# Patient Record
Sex: Female | Born: 1970 | Race: White | Hispanic: No | Marital: Married | Smoking: Former smoker
Health system: Southern US, Community
[De-identification: ages and names within clinical notes are randomized; demographics above are authoritative.]

## PROBLEM LIST (undated history)

## (undated) DIAGNOSIS — F32A Depression, unspecified: Secondary | ICD-10-CM

## (undated) DIAGNOSIS — N951 Menopausal and female climacteric states: Secondary | ICD-10-CM

## (undated) DIAGNOSIS — I2510 Atherosclerotic heart disease of native coronary artery without angina pectoris: Secondary | ICD-10-CM

## (undated) DIAGNOSIS — M797 Fibromyalgia: Secondary | ICD-10-CM

## (undated) DIAGNOSIS — IMO0002 Reserved for concepts with insufficient information to code with codable children: Secondary | ICD-10-CM

## (undated) DIAGNOSIS — M549 Dorsalgia, unspecified: Secondary | ICD-10-CM

## (undated) DIAGNOSIS — G2581 Restless legs syndrome: Secondary | ICD-10-CM

## (undated) DIAGNOSIS — G43909 Migraine, unspecified, not intractable, without status migrainosus: Secondary | ICD-10-CM

## (undated) DIAGNOSIS — G8929 Other chronic pain: Secondary | ICD-10-CM

## (undated) DIAGNOSIS — K219 Gastro-esophageal reflux disease without esophagitis: Secondary | ICD-10-CM

## (undated) DIAGNOSIS — E785 Hyperlipidemia, unspecified: Secondary | ICD-10-CM

## (undated) DIAGNOSIS — F329 Major depressive disorder, single episode, unspecified: Secondary | ICD-10-CM

## (undated) DIAGNOSIS — I10 Essential (primary) hypertension: Secondary | ICD-10-CM

## (undated) DIAGNOSIS — F319 Bipolar disorder, unspecified: Secondary | ICD-10-CM

## (undated) HISTORY — DX: Dorsalgia, unspecified: M54.9

## (undated) HISTORY — DX: Major depressive disorder, single episode, unspecified: F32.9

## (undated) HISTORY — DX: Fibromyalgia: M79.7

## (undated) HISTORY — DX: Bipolar disorder, unspecified: F31.9

## (undated) HISTORY — DX: Gastro-esophageal reflux disease without esophagitis: K21.9

## (undated) HISTORY — DX: Essential (primary) hypertension: I10

## (undated) HISTORY — DX: Menopausal and female climacteric states: N95.1

## (undated) HISTORY — DX: Hyperlipidemia, unspecified: E78.5

## (undated) HISTORY — DX: Depression, unspecified: F32.A

## (undated) HISTORY — DX: Other chronic pain: G89.29

## (undated) HISTORY — PX: CHOLECYSTECTOMY: SHX55

## (undated) HISTORY — DX: Atherosclerotic heart disease of native coronary artery without angina pectoris: I25.10

## (undated) HISTORY — DX: Restless legs syndrome: G25.81

## (undated) HISTORY — PX: ABDOMINAL HYSTERECTOMY: SHX81

## (undated) HISTORY — DX: Reserved for concepts with insufficient information to code with codable children: IMO0002

---

## 2003-04-02 HISTORY — PX: CARDIAC CATHETERIZATION: SHX172

## 2003-04-02 HISTORY — PX: DOPPLER ECHOCARDIOGRAPHY: SHX263

## 2003-04-02 HISTORY — PX: OTHER SURGICAL HISTORY: SHX169

## 2003-12-13 ENCOUNTER — Inpatient Hospital Stay (HOSPITAL_COMMUNITY): Admission: EM | Admit: 2003-12-13 | Discharge: 2003-12-16 | Payer: Self-pay | Admitting: Emergency Medicine

## 2003-12-13 ENCOUNTER — Encounter (INDEPENDENT_AMBULATORY_CARE_PROVIDER_SITE_OTHER): Payer: Self-pay | Admitting: *Deleted

## 2003-12-15 HISTORY — PX: OTHER SURGICAL HISTORY: SHX169

## 2004-01-11 ENCOUNTER — Ambulatory Visit (HOSPITAL_COMMUNITY): Admission: RE | Admit: 2004-01-11 | Discharge: 2004-01-11 | Payer: Self-pay | Admitting: Interventional Cardiology

## 2004-01-12 ENCOUNTER — Ambulatory Visit (HOSPITAL_COMMUNITY): Admission: RE | Admit: 2004-01-12 | Discharge: 2004-01-12 | Payer: Self-pay | Admitting: Cardiology

## 2004-01-21 ENCOUNTER — Emergency Department (HOSPITAL_COMMUNITY): Admission: EM | Admit: 2004-01-21 | Discharge: 2004-01-22 | Payer: Self-pay | Admitting: Emergency Medicine

## 2005-01-24 ENCOUNTER — Emergency Department (HOSPITAL_COMMUNITY): Admission: EM | Admit: 2005-01-24 | Discharge: 2005-01-24 | Payer: Self-pay | Admitting: Emergency Medicine

## 2005-01-25 ENCOUNTER — Emergency Department (HOSPITAL_COMMUNITY): Admission: EM | Admit: 2005-01-25 | Discharge: 2005-01-25 | Payer: Self-pay | Admitting: Emergency Medicine

## 2005-03-27 ENCOUNTER — Observation Stay (HOSPITAL_COMMUNITY): Admission: EM | Admit: 2005-03-27 | Discharge: 2005-03-29 | Payer: Self-pay | Admitting: Emergency Medicine

## 2006-04-30 ENCOUNTER — Ambulatory Visit (HOSPITAL_COMMUNITY): Payer: Self-pay | Admitting: Psychiatry

## 2006-05-14 ENCOUNTER — Ambulatory Visit (HOSPITAL_COMMUNITY): Payer: Self-pay | Admitting: Psychiatry

## 2006-05-21 ENCOUNTER — Ambulatory Visit (HOSPITAL_COMMUNITY): Payer: Self-pay | Admitting: Psychiatry

## 2006-06-11 ENCOUNTER — Ambulatory Visit (HOSPITAL_COMMUNITY): Payer: Self-pay | Admitting: Psychiatry

## 2006-07-09 ENCOUNTER — Ambulatory Visit (HOSPITAL_COMMUNITY): Payer: Self-pay | Admitting: Psychiatry

## 2007-03-19 ENCOUNTER — Ambulatory Visit: Payer: Self-pay | Admitting: Family Medicine

## 2007-03-19 ENCOUNTER — Telehealth: Payer: Self-pay | Admitting: Family Medicine

## 2007-03-19 DIAGNOSIS — G8190 Hemiplegia, unspecified affecting unspecified side: Secondary | ICD-10-CM | POA: Insufficient documentation

## 2007-03-19 DIAGNOSIS — E785 Hyperlipidemia, unspecified: Secondary | ICD-10-CM | POA: Insufficient documentation

## 2007-03-19 DIAGNOSIS — F319 Bipolar disorder, unspecified: Secondary | ICD-10-CM | POA: Insufficient documentation

## 2007-03-20 ENCOUNTER — Encounter: Payer: Self-pay | Admitting: Family Medicine

## 2007-03-20 LAB — CONVERTED CEMR LAB
ALT: 12 units/L (ref 0–35)
AST: 15 units/L (ref 0–37)
Albumin: 4.7 g/dL (ref 3.5–5.2)
Alkaline Phosphatase: 59 units/L (ref 39–117)
BUN: 8 mg/dL (ref 6–23)
Basophils Absolute: 0 10*3/uL (ref 0.0–0.1)
Basophils Relative: 0 % (ref 0–1)
CO2: 27 meq/L (ref 19–32)
Calcium: 10 mg/dL (ref 8.4–10.5)
Chloride: 101 meq/L (ref 96–112)
Creatinine, Ser: 0.64 mg/dL (ref 0.40–1.20)
Direct LDL: 141 mg/dL — ABNORMAL HIGH
Eosinophils Absolute: 0.1 10*3/uL — ABNORMAL LOW (ref 0.2–0.7)
Eosinophils Relative: 1 % (ref 0–5)
Glucose, Bld: 87 mg/dL (ref 70–99)
HCT: 44.1 % (ref 36.0–46.0)
Hemoglobin: 14.5 g/dL (ref 12.0–15.0)
Lymphocytes Relative: 37 % (ref 12–46)
Lymphs Abs: 3.2 10*3/uL (ref 0.7–4.0)
MCHC: 32.9 g/dL (ref 30.0–36.0)
MCV: 100 fL (ref 78.0–100.0)
Monocytes Absolute: 0.8 10*3/uL (ref 0.1–1.0)
Monocytes Relative: 9 % (ref 3–12)
Neutro Abs: 4.5 10*3/uL (ref 1.7–7.7)
Neutrophils Relative %: 52 % (ref 43–77)
Platelets: 282 10*3/uL (ref 150–400)
Potassium: 3.9 meq/L (ref 3.5–5.3)
RBC: 4.41 M/uL (ref 3.87–5.11)
RDW: 13.2 % (ref 11.5–15.5)
Sodium: 142 meq/L (ref 135–145)
Total Bilirubin: 0.5 mg/dL (ref 0.3–1.2)
Total Protein: 8 g/dL (ref 6.0–8.3)
WBC: 8.6 10*3/uL (ref 4.0–10.5)

## 2007-03-23 ENCOUNTER — Telehealth (INDEPENDENT_AMBULATORY_CARE_PROVIDER_SITE_OTHER): Payer: Self-pay | Admitting: *Deleted

## 2007-03-24 ENCOUNTER — Ambulatory Visit: Payer: Self-pay | Admitting: Family Medicine

## 2007-03-24 DIAGNOSIS — M545 Low back pain, unspecified: Secondary | ICD-10-CM | POA: Insufficient documentation

## 2007-04-06 ENCOUNTER — Telehealth: Payer: Self-pay | Admitting: Family Medicine

## 2007-04-10 ENCOUNTER — Encounter: Admission: RE | Admit: 2007-04-10 | Discharge: 2007-04-10 | Payer: Self-pay | Admitting: Orthopedic Surgery

## 2007-04-10 ENCOUNTER — Encounter: Payer: Self-pay | Admitting: Family Medicine

## 2007-04-17 ENCOUNTER — Encounter: Admission: RE | Admit: 2007-04-17 | Discharge: 2007-04-17 | Payer: Self-pay | Admitting: Orthopedic Surgery

## 2007-05-05 ENCOUNTER — Encounter: Payer: Self-pay | Admitting: Family Medicine

## 2007-05-28 ENCOUNTER — Ambulatory Visit: Payer: Self-pay | Admitting: Family Medicine

## 2007-05-28 DIAGNOSIS — F410 Panic disorder [episodic paroxysmal anxiety] without agoraphobia: Secondary | ICD-10-CM | POA: Insufficient documentation

## 2007-07-16 ENCOUNTER — Encounter: Payer: Self-pay | Admitting: Family Medicine

## 2007-11-19 ENCOUNTER — Telehealth: Payer: Self-pay | Admitting: Family Medicine

## 2008-03-30 ENCOUNTER — Ambulatory Visit: Payer: Self-pay | Admitting: Family Medicine

## 2008-03-31 ENCOUNTER — Telehealth: Payer: Self-pay | Admitting: Family Medicine

## 2008-04-04 ENCOUNTER — Telehealth: Payer: Self-pay | Admitting: Family Medicine

## 2008-04-12 ENCOUNTER — Telehealth: Payer: Self-pay | Admitting: Family Medicine

## 2008-04-21 ENCOUNTER — Telehealth (INDEPENDENT_AMBULATORY_CARE_PROVIDER_SITE_OTHER): Payer: Self-pay | Admitting: *Deleted

## 2008-05-03 ENCOUNTER — Encounter: Payer: Self-pay | Admitting: Family Medicine

## 2008-06-01 ENCOUNTER — Ambulatory Visit: Payer: Self-pay | Admitting: Family Medicine

## 2008-06-29 ENCOUNTER — Ambulatory Visit (HOSPITAL_COMMUNITY): Payer: Self-pay | Admitting: Psychiatry

## 2008-07-11 ENCOUNTER — Ambulatory Visit (HOSPITAL_COMMUNITY): Payer: Self-pay | Admitting: Psychiatry

## 2008-07-19 ENCOUNTER — Ambulatory Visit (HOSPITAL_COMMUNITY): Payer: Self-pay | Admitting: Psychiatry

## 2008-07-20 ENCOUNTER — Ambulatory Visit (HOSPITAL_COMMUNITY): Payer: Self-pay | Admitting: Psychiatry

## 2008-09-15 ENCOUNTER — Ambulatory Visit (HOSPITAL_COMMUNITY): Payer: Self-pay | Admitting: Psychiatry

## 2008-10-06 ENCOUNTER — Ambulatory Visit: Payer: Self-pay | Admitting: Family Medicine

## 2008-10-06 DIAGNOSIS — M5137 Other intervertebral disc degeneration, lumbosacral region: Secondary | ICD-10-CM | POA: Insufficient documentation

## 2008-10-06 DIAGNOSIS — N3946 Mixed incontinence: Secondary | ICD-10-CM | POA: Insufficient documentation

## 2008-10-06 LAB — CONVERTED CEMR LAB
Bilirubin Urine: NEGATIVE
Blood in Urine, dipstick: NEGATIVE
Glucose, Urine, Semiquant: NEGATIVE
Ketones, urine, test strip: NEGATIVE
Nitrite: NEGATIVE
Protein, U semiquant: NEGATIVE
Specific Gravity, Urine: >=1.03
Urobilinogen, UA: 0.2
WBC Urine, dipstick: NEGATIVE
pH: 6

## 2008-10-08 ENCOUNTER — Encounter: Admission: RE | Admit: 2008-10-08 | Discharge: 2008-10-08 | Payer: Self-pay | Admitting: Family Medicine

## 2008-10-10 ENCOUNTER — Telehealth: Payer: Self-pay | Admitting: Family Medicine

## 2008-10-13 ENCOUNTER — Telehealth: Payer: Self-pay | Admitting: Family Medicine

## 2008-10-13 ENCOUNTER — Observation Stay (HOSPITAL_COMMUNITY): Admission: EM | Admit: 2008-10-13 | Discharge: 2008-10-14 | Payer: Self-pay | Admitting: Emergency Medicine

## 2008-10-13 ENCOUNTER — Ambulatory Visit: Payer: Self-pay | Admitting: Internal Medicine

## 2008-10-18 ENCOUNTER — Ambulatory Visit: Payer: Self-pay | Admitting: Family Medicine

## 2008-10-20 ENCOUNTER — Telehealth: Payer: Self-pay | Admitting: Family Medicine

## 2008-10-24 ENCOUNTER — Telehealth (INDEPENDENT_AMBULATORY_CARE_PROVIDER_SITE_OTHER): Payer: Self-pay | Admitting: *Deleted

## 2008-10-25 ENCOUNTER — Telehealth (INDEPENDENT_AMBULATORY_CARE_PROVIDER_SITE_OTHER): Payer: Self-pay | Admitting: *Deleted

## 2008-11-01 ENCOUNTER — Ambulatory Visit (HOSPITAL_COMMUNITY): Payer: Self-pay | Admitting: Psychiatry

## 2008-11-03 ENCOUNTER — Telehealth (INDEPENDENT_AMBULATORY_CARE_PROVIDER_SITE_OTHER): Payer: Self-pay | Admitting: *Deleted

## 2008-11-04 ENCOUNTER — Telehealth: Payer: Self-pay | Admitting: Family Medicine

## 2008-11-09 ENCOUNTER — Telehealth (INDEPENDENT_AMBULATORY_CARE_PROVIDER_SITE_OTHER): Payer: Self-pay | Admitting: *Deleted

## 2008-11-11 ENCOUNTER — Telehealth: Payer: Self-pay | Admitting: Family Medicine

## 2008-11-14 ENCOUNTER — Ambulatory Visit: Payer: Self-pay | Admitting: Family Medicine

## 2008-11-14 DIAGNOSIS — R32 Unspecified urinary incontinence: Secondary | ICD-10-CM | POA: Insufficient documentation

## 2008-11-14 DIAGNOSIS — R159 Full incontinence of feces: Secondary | ICD-10-CM | POA: Insufficient documentation

## 2008-11-14 LAB — CONVERTED CEMR LAB
Bilirubin Urine: NEGATIVE
Blood in Urine, dipstick: NEGATIVE
Glucose, Urine, Semiquant: NEGATIVE
Ketones, urine, test strip: NEGATIVE
Nitrite: NEGATIVE
Protein, U semiquant: NEGATIVE
Specific Gravity, Urine: 1.005
Urobilinogen, UA: 0.2
WBC Urine, dipstick: NEGATIVE
pH: 5.5

## 2008-11-15 ENCOUNTER — Encounter: Payer: Self-pay | Admitting: Family Medicine

## 2008-11-16 ENCOUNTER — Telehealth (INDEPENDENT_AMBULATORY_CARE_PROVIDER_SITE_OTHER): Payer: Self-pay | Admitting: *Deleted

## 2008-11-22 ENCOUNTER — Telehealth (INDEPENDENT_AMBULATORY_CARE_PROVIDER_SITE_OTHER): Payer: Self-pay | Admitting: *Deleted

## 2008-11-23 ENCOUNTER — Telehealth: Payer: Self-pay | Admitting: Family Medicine

## 2008-11-24 ENCOUNTER — Ambulatory Visit: Payer: Self-pay | Admitting: Family Medicine

## 2008-11-24 ENCOUNTER — Telehealth (INDEPENDENT_AMBULATORY_CARE_PROVIDER_SITE_OTHER): Payer: Self-pay | Admitting: *Deleted

## 2008-11-25 LAB — CONVERTED CEMR LAB
ALT: 13 units/L (ref 0–35)
AST: 15 units/L (ref 0–37)
Albumin: 4.3 g/dL (ref 3.5–5.2)
Alkaline Phosphatase: 91 units/L (ref 39–117)
BUN: 6 mg/dL (ref 6–23)
Basophils Absolute: 0 10*3/uL (ref 0.0–0.1)
Basophils Relative: 0 % (ref 0–1)
CO2: 23 meq/L (ref 19–32)
Calcium: 9.7 mg/dL (ref 8.4–10.5)
Chloride: 102 meq/L (ref 96–112)
Creatinine, Ser: 0.62 mg/dL (ref 0.40–1.20)
Eosinophils Absolute: 0.1 10*3/uL (ref 0.0–0.7)
Eosinophils Relative: 2 % (ref 0–5)
Glucose, Bld: 84 mg/dL (ref 70–99)
HCT: 42.8 % (ref 36.0–46.0)
Hemoglobin: 14 g/dL (ref 12.0–15.0)
Lymphocytes Relative: 28 % (ref 12–46)
Lymphs Abs: 2.3 10*3/uL (ref 0.7–4.0)
MCHC: 32.7 g/dL (ref 30.0–36.0)
MCV: 95.7 fL (ref 78.0–100.0)
Monocytes Absolute: 0.6 10*3/uL (ref 0.1–1.0)
Monocytes Relative: 7 % (ref 3–12)
Neutro Abs: 5.4 10*3/uL (ref 1.7–7.7)
Neutrophils Relative %: 63 % (ref 43–77)
Platelets: 327 10*3/uL (ref 150–400)
Potassium: 4.2 meq/L (ref 3.5–5.3)
RBC: 4.47 M/uL (ref 3.87–5.11)
RDW: 14.1 % (ref 11.5–15.5)
Sodium: 140 meq/L (ref 135–145)
TSH: 0.937 microintl units/mL (ref 0.350–4.500)
Total Bilirubin: 0.2 mg/dL — ABNORMAL LOW (ref 0.3–1.2)
Total Protein: 7.7 g/dL (ref 6.0–8.3)
WBC: 8.4 10*3/uL (ref 4.0–10.5)

## 2008-12-12 ENCOUNTER — Telehealth: Payer: Self-pay | Admitting: Family Medicine

## 2008-12-14 ENCOUNTER — Encounter: Payer: Self-pay | Admitting: Family Medicine

## 2008-12-20 ENCOUNTER — Telehealth: Payer: Self-pay | Admitting: Family Medicine

## 2008-12-30 ENCOUNTER — Ambulatory Visit (HOSPITAL_COMMUNITY): Payer: Self-pay | Admitting: Psychiatry

## 2009-02-27 ENCOUNTER — Telehealth (INDEPENDENT_AMBULATORY_CARE_PROVIDER_SITE_OTHER): Payer: Self-pay | Admitting: *Deleted

## 2009-03-14 ENCOUNTER — Ambulatory Visit (HOSPITAL_COMMUNITY): Payer: Self-pay | Admitting: Psychiatry

## 2009-03-27 ENCOUNTER — Telehealth: Payer: Self-pay | Admitting: Family Medicine

## 2009-04-03 ENCOUNTER — Ambulatory Visit: Payer: Self-pay | Admitting: Family Medicine

## 2009-04-03 DIAGNOSIS — F17200 Nicotine dependence, unspecified, uncomplicated: Secondary | ICD-10-CM | POA: Insufficient documentation

## 2009-04-03 DIAGNOSIS — N318 Other neuromuscular dysfunction of bladder: Secondary | ICD-10-CM | POA: Insufficient documentation

## 2009-04-03 LAB — CONVERTED CEMR LAB
Bilirubin Urine: NEGATIVE
Glucose, Urine, Semiquant: NEGATIVE
Ketones, urine, test strip: NEGATIVE
Nitrite: NEGATIVE
Protein, U semiquant: NEGATIVE
Specific Gravity, Urine: 1.02
Urobilinogen, UA: 0.2
pH: 6.5

## 2009-04-06 ENCOUNTER — Ambulatory Visit (HOSPITAL_COMMUNITY): Payer: Self-pay | Admitting: Psychiatry

## 2009-04-12 ENCOUNTER — Telehealth: Payer: Self-pay | Admitting: Family Medicine

## 2009-04-13 ENCOUNTER — Ambulatory Visit (HOSPITAL_COMMUNITY): Payer: Self-pay | Admitting: Psychiatry

## 2009-04-13 ENCOUNTER — Telehealth: Payer: Self-pay | Admitting: Family Medicine

## 2009-04-20 ENCOUNTER — Ambulatory Visit (HOSPITAL_COMMUNITY): Payer: Self-pay | Admitting: Psychiatry

## 2009-04-24 ENCOUNTER — Ambulatory Visit: Payer: Self-pay | Admitting: Family Medicine

## 2009-04-24 DIAGNOSIS — IMO0001 Reserved for inherently not codable concepts without codable children: Secondary | ICD-10-CM | POA: Insufficient documentation

## 2009-04-25 ENCOUNTER — Encounter (INDEPENDENT_AMBULATORY_CARE_PROVIDER_SITE_OTHER): Payer: Self-pay | Admitting: *Deleted

## 2009-04-28 ENCOUNTER — Ambulatory Visit (HOSPITAL_COMMUNITY): Payer: Self-pay | Admitting: Psychiatry

## 2009-05-02 ENCOUNTER — Encounter: Payer: Self-pay | Admitting: Family Medicine

## 2009-05-03 ENCOUNTER — Ambulatory Visit (HOSPITAL_COMMUNITY): Payer: Self-pay | Admitting: Psychiatry

## 2009-05-04 ENCOUNTER — Encounter: Payer: Self-pay | Admitting: Family Medicine

## 2009-05-22 ENCOUNTER — Encounter: Payer: Self-pay | Admitting: Family Medicine

## 2009-05-24 ENCOUNTER — Encounter: Payer: Self-pay | Admitting: Family Medicine

## 2009-05-26 LAB — CONVERTED CEMR LAB
ALT: 13 units/L (ref 0–35)
AST: 14 units/L (ref 0–37)
Albumin: 4.2 g/dL (ref 3.5–5.2)
Alkaline Phosphatase: 83 units/L (ref 39–117)
BUN: 8 mg/dL (ref 6–23)
CO2: 31 meq/L (ref 19–32)
Calcium: 9.5 mg/dL (ref 8.4–10.5)
Chloride: 105 meq/L (ref 96–112)
Cholesterol: 267 mg/dL — ABNORMAL HIGH (ref 0–200)
Creatinine, Ser: 0.68 mg/dL (ref 0.40–1.20)
Glucose, Bld: 79 mg/dL (ref 70–99)
HDL: 36 mg/dL — ABNORMAL LOW (ref >39)
LDL Cholesterol: 167 mg/dL — ABNORMAL HIGH (ref 0–99)
Potassium: 4.5 meq/L (ref 3.5–5.3)
Sodium: 143 meq/L (ref 135–145)
Total Bilirubin: 0.2 mg/dL — ABNORMAL LOW (ref 0.3–1.2)
Total CHOL/HDL Ratio: 7.4
Total Protein: 6.9 g/dL (ref 6.0–8.3)
Triglycerides: 320 mg/dL — ABNORMAL HIGH (ref <150)
VLDL: 64 mg/dL — ABNORMAL HIGH (ref 0–40)

## 2009-06-01 ENCOUNTER — Encounter: Payer: Self-pay | Admitting: Family Medicine

## 2009-06-09 ENCOUNTER — Ambulatory Visit: Payer: Self-pay | Admitting: Family Medicine

## 2009-06-09 ENCOUNTER — Telehealth: Payer: Self-pay | Admitting: Family Medicine

## 2009-06-09 DIAGNOSIS — G43009 Migraine without aura, not intractable, without status migrainosus: Secondary | ICD-10-CM | POA: Insufficient documentation

## 2009-06-09 DIAGNOSIS — R112 Nausea with vomiting, unspecified: Secondary | ICD-10-CM | POA: Insufficient documentation

## 2009-06-10 ENCOUNTER — Emergency Department (HOSPITAL_BASED_OUTPATIENT_CLINIC_OR_DEPARTMENT_OTHER): Admission: EM | Admit: 2009-06-10 | Discharge: 2009-06-10 | Payer: Self-pay | Admitting: Emergency Medicine

## 2009-06-13 ENCOUNTER — Encounter: Payer: Self-pay | Admitting: Family Medicine

## 2009-06-19 ENCOUNTER — Ambulatory Visit: Payer: Self-pay | Admitting: Family Medicine

## 2009-06-19 DIAGNOSIS — I10 Essential (primary) hypertension: Secondary | ICD-10-CM | POA: Insufficient documentation

## 2009-06-30 ENCOUNTER — Ambulatory Visit (HOSPITAL_COMMUNITY): Payer: Self-pay | Admitting: Psychiatry

## 2009-07-04 ENCOUNTER — Encounter: Payer: Self-pay | Admitting: Family Medicine

## 2009-07-17 ENCOUNTER — Ambulatory Visit (HOSPITAL_COMMUNITY): Payer: Self-pay | Admitting: Licensed Clinical Social Worker

## 2009-07-18 ENCOUNTER — Encounter: Payer: Self-pay | Admitting: Family Medicine

## 2009-07-31 ENCOUNTER — Ambulatory Visit: Payer: Self-pay | Admitting: Family Medicine

## 2009-08-01 ENCOUNTER — Ambulatory Visit (HOSPITAL_COMMUNITY): Payer: Self-pay | Admitting: Licensed Clinical Social Worker

## 2009-08-18 ENCOUNTER — Encounter: Payer: Self-pay | Admitting: Family Medicine

## 2009-08-21 ENCOUNTER — Telehealth: Payer: Self-pay | Admitting: Family Medicine

## 2009-08-24 ENCOUNTER — Telehealth (INDEPENDENT_AMBULATORY_CARE_PROVIDER_SITE_OTHER): Payer: Self-pay | Admitting: *Deleted

## 2009-08-30 ENCOUNTER — Ambulatory Visit (HOSPITAL_COMMUNITY): Payer: Self-pay | Admitting: Psychiatry

## 2009-08-31 ENCOUNTER — Encounter: Payer: Self-pay | Admitting: Family Medicine

## 2009-12-05 ENCOUNTER — Ambulatory Visit (HOSPITAL_COMMUNITY): Payer: Self-pay | Admitting: Psychiatry

## 2009-12-06 ENCOUNTER — Ambulatory Visit: Payer: Self-pay | Admitting: Family Medicine

## 2009-12-06 DIAGNOSIS — R609 Edema, unspecified: Secondary | ICD-10-CM | POA: Insufficient documentation

## 2009-12-06 DIAGNOSIS — R42 Dizziness and giddiness: Secondary | ICD-10-CM | POA: Insufficient documentation

## 2009-12-07 LAB — CONVERTED CEMR LAB
ALT: 12 units/L (ref 0–35)
AST: 15 units/L (ref 0–37)
Albumin: 4 g/dL (ref 3.5–5.2)
Alkaline Phosphatase: 100 units/L (ref 39–117)
BUN: 4 mg/dL — ABNORMAL LOW (ref 6–23)
CO2: 29 meq/L (ref 19–32)
Calcium: 9.8 mg/dL (ref 8.4–10.5)
Chloride: 105 meq/L (ref 96–112)
Creatinine, Ser: 0.62 mg/dL (ref 0.40–1.20)
Direct LDL: 178 mg/dL — ABNORMAL HIGH
Glucose, Bld: 100 mg/dL — ABNORMAL HIGH (ref 70–99)
Potassium: 4.7 meq/L (ref 3.5–5.3)
Sodium: 142 meq/L (ref 135–145)
Total Bilirubin: 0.3 mg/dL (ref 0.3–1.2)
Total Protein: 7.3 g/dL (ref 6.0–8.3)

## 2009-12-08 ENCOUNTER — Encounter: Payer: Self-pay | Admitting: Family Medicine

## 2009-12-15 ENCOUNTER — Ambulatory Visit (HOSPITAL_COMMUNITY): Payer: Self-pay | Admitting: Licensed Clinical Social Worker

## 2009-12-25 ENCOUNTER — Encounter: Payer: Self-pay | Admitting: Family Medicine

## 2009-12-29 ENCOUNTER — Ambulatory Visit (HOSPITAL_COMMUNITY): Payer: Self-pay | Admitting: Licensed Clinical Social Worker

## 2010-01-01 ENCOUNTER — Encounter: Payer: Self-pay | Admitting: Family Medicine

## 2010-01-12 ENCOUNTER — Ambulatory Visit (HOSPITAL_COMMUNITY): Payer: Self-pay | Admitting: Licensed Clinical Social Worker

## 2010-02-05 ENCOUNTER — Ambulatory Visit (HOSPITAL_COMMUNITY): Payer: Self-pay | Admitting: Psychiatry

## 2010-02-06 ENCOUNTER — Encounter: Payer: Self-pay | Admitting: Family Medicine

## 2010-02-14 ENCOUNTER — Encounter: Payer: Self-pay | Admitting: Family Medicine

## 2010-03-01 ENCOUNTER — Ambulatory Visit (HOSPITAL_COMMUNITY): Payer: Self-pay | Admitting: Licensed Clinical Social Worker

## 2010-03-05 ENCOUNTER — Ambulatory Visit: Payer: Self-pay | Admitting: Family Medicine

## 2010-03-05 ENCOUNTER — Encounter: Admission: RE | Admit: 2010-03-05 | Discharge: 2010-03-05 | Payer: Self-pay | Admitting: Family Medicine

## 2010-03-05 DIAGNOSIS — R079 Chest pain, unspecified: Secondary | ICD-10-CM | POA: Insufficient documentation

## 2010-03-06 ENCOUNTER — Encounter: Payer: Self-pay | Admitting: Family Medicine

## 2010-03-06 LAB — CONVERTED CEMR LAB
ALT: 13 units/L (ref 0–35)
AST: 13 units/L (ref 0–37)
Albumin: 4.2 g/dL (ref 3.5–5.2)
Alkaline Phosphatase: 83 units/L (ref 39–117)
BUN: 4 mg/dL — ABNORMAL LOW (ref 6–23)
Basophils Absolute: 0 10*3/uL (ref 0.0–0.1)
Basophils Relative: 0 % (ref 0–1)
CO2: 23 meq/L (ref 19–32)
Calcium: 9.9 mg/dL (ref 8.4–10.5)
Chloride: 109 meq/L (ref 96–112)
Creatinine, Ser: 0.65 mg/dL (ref 0.40–1.20)
Eosinophils Absolute: 0.1 10*3/uL (ref 0.0–0.7)
Eosinophils Relative: 1 % (ref 0–5)
Glucose, Bld: 102 mg/dL — ABNORMAL HIGH (ref 70–99)
HCT: 40 % (ref 36.0–46.0)
Hemoglobin: 13 g/dL (ref 12.0–15.0)
Lipase: 12 units/L (ref 0–75)
Lymphocytes Relative: 38 % (ref 12–46)
Lymphs Abs: 4.2 10*3/uL — ABNORMAL HIGH (ref 0.7–4.0)
MCHC: 32.5 g/dL (ref 30.0–36.0)
MCV: 98.5 fL (ref 78.0–100.0)
Monocytes Absolute: 0.8 10*3/uL (ref 0.1–1.0)
Monocytes Relative: 7 % (ref 3–12)
Neutro Abs: 6 10*3/uL (ref 1.7–7.7)
Neutrophils Relative %: 55 % (ref 43–77)
Platelets: 355 10*3/uL (ref 150–400)
Potassium: 4.3 meq/L (ref 3.5–5.3)
RBC: 4.06 M/uL (ref 3.87–5.11)
RDW: 13.4 % (ref 11.5–15.5)
Sodium: 144 meq/L (ref 135–145)
Total Bilirubin: 0.2 mg/dL — ABNORMAL LOW (ref 0.3–1.2)
Total Protein: 7.3 g/dL (ref 6.0–8.3)
WBC: 11 10*3/uL — ABNORMAL HIGH (ref 4.0–10.5)

## 2010-03-09 ENCOUNTER — Ambulatory Visit (HOSPITAL_COMMUNITY): Payer: Self-pay | Admitting: Licensed Clinical Social Worker

## 2010-03-20 ENCOUNTER — Ambulatory Visit (HOSPITAL_COMMUNITY): Payer: Self-pay | Admitting: Licensed Clinical Social Worker

## 2010-03-29 ENCOUNTER — Ambulatory Visit (HOSPITAL_COMMUNITY): Payer: Self-pay | Admitting: Licensed Clinical Social Worker

## 2010-04-06 ENCOUNTER — Ambulatory Visit (HOSPITAL_COMMUNITY)
Admission: RE | Admit: 2010-04-06 | Discharge: 2010-04-06 | Payer: Self-pay | Source: Home / Self Care | Attending: Psychiatry | Admitting: Psychiatry

## 2010-04-06 ENCOUNTER — Ambulatory Visit
Admission: RE | Admit: 2010-04-06 | Discharge: 2010-04-06 | Payer: Self-pay | Source: Home / Self Care | Attending: Family Medicine | Admitting: Family Medicine

## 2010-04-20 ENCOUNTER — Ambulatory Visit (HOSPITAL_COMMUNITY): Admit: 2010-04-20 | Payer: Self-pay | Admitting: Licensed Clinical Social Worker

## 2010-05-02 NOTE — Letter (Signed)
Summary: [facility]   Imported By: Lanelle Bal 09/11/2009 12:06:28  _____________________________________________________________________  External Attachment:    Type:   Image     Comment:   External Document

## 2010-05-02 NOTE — Progress Notes (Signed)
Summary: Migrane  Phone Note Call from Patient   Caller: Patient Summary of Call: Dr.Melady Chow    Call Back  [phone]   pharmacy [facility] in [place]  patient said she has had a Migrane for 3 days and she want to know what can she do?  Patient is aware that not more appts sre open for today.Marland KitchenMarland KitchenMarland KitchenGive a Call Back Initial call taken by: Vanessa Swaziland,  June 09, 2009 2:11 PM  Follow-up for Phone Call        recommend visit to UC Follow-up by: Seymour Bars DO,  June 09, 2009 3:04 PM     Appended Document: Migrane Pt aware

## 2010-05-02 NOTE — Assessment & Plan Note (Signed)
Summary: f/u migraines/ HTN   Vital Signs:  Patient profile:   40 year old female Height:      67 inches Weight:      183 pounds BMI:     28.77 O2 Sat:      96 % on Room air Temp:     98.4 degrees F oral Pulse rate:   92 / minute BP sitting:   147 / 99  (left arm) Cuff size:   regular  Vitals Entered By: Payton Spark CMA (June 19, 2009 9:57 AM)  O2 Flow:  Room air CC: 3 mo f/u migraines   Primary Care Provider:  Seymour Bars DO  CC:  3 mo f/u migraines.  History of Present Illness: 40 yo WF presents for f/u migraines.  She went to the ED last wk for a severe migraine.  She is under a lot of stress.  her son was in an MVA and broke his neck.  She is seeing Dr Christell Constant and Merlene Morse.  Her BPs and weights have been going up.   She usually gets mild to moderate HAs 1-2 x a wk and a severe migraine about 1 x  every 6 months.  Excedrin migraine usually helps the mild ones.    She is on neurontin for chronic back pain.   She is seeing Dr Jordan Likes for back injections.    She goes to the urology.  Oxybutynin is not helping her OAB.  Started on Simvastin 40 mg the end of Feb.      Current Medications (verified): 1)  Protonix 40 Mg  Tbec (Pantoprazole Sodium) .... Take 1 Tablet By Mouth Once A Day 2)  Alprazolam 0.5 Mg  Tabs (Alprazolam) .Marland Kitchen.. 1 Tab By Mouth Two Times A Day As Needed Anxiety 3)  Zoloft 100 Mg Tabs (Sertraline Hcl) .... 1.5 Tabs By Mouth Daily 4)  Cyclobenzaprine Hcl 10 Mg  Tabs (Cyclobenzaprine Hcl) .... Take 1 Tablet By Mouth Once A Day At Bedtime As Needed 5)  Seroquel 50 Mg Tabs (Quetiapine Fumarate) .Marland Kitchen.. 1 Tab By Mouth Qhs 6)  Gabapentin 600 Mg Tabs (Gabapentin) .Marland Kitchen.. 1 Tab By Mouth Tid 7)  Mobic 15 Mg Tabs (Meloxicam) .Marland Kitchen.. 1 Tab By Mouth Daily W/ Food 8)  Promethazine Hcl 25 Mg Tabs (Promethazine Hcl) .Marland Kitchen.. 1 Tab By Mouth Q 6 Hrs As Needed Nausea 9)  Lamictal 10)  Oxybutynin Chloride 5 Mg Tabs (Oxybutynin Chloride) .Marland Kitchen.. 1 Tab By Mouth Bid 11)  Simvastatin 40 Mg  Tabs (Simvastatin) .Marland Kitchen.. 1 Tab By Mouth Qhs  Allergies (verified): 1)  ! * Lyrica 2)  ! Benadryl  Past History:  Past Medical History: Reviewed history from 11/14/2008 and no changes required. bipolar d/o-- Dr Christell Constant menopausal syndrome fibromyalgia CAD, 50% occlusion on cath 2006 (Dr Garnette Scheuermann) high chol premature menopause on HRT since 2006 G2P2002 chronic back pain GERD RLS L4-5 disc protrusion with disc dessication and narrowing on MRI 2005; repeat 09-2008  Past Surgical History: Reviewed history from 03/19/2007 and no changes required. TAH with oophorectomy for DUB/ovarian cysts  Social History: Reviewed history from 03/19/2007 and no changes required. On disability for depression.  Used to work at [employer]. Finished HS.  Married to [place]. 2 teenage sons. Smokes 1 ppd x 10 yrs. 4 ETOH/ wk. Plays basketball and walks.  Review of Systems      See HPI  Physical Exam  General:  overwt appearing WF in NAD Head:  normocephalic and atraumatic.   Eyes:  pupils  equal, pupils round, and pupils reactive to light.   Ears:  no external deformities.   Nose:  no nasal discharge.   Mouth:  pharynx pink and moist.   Neck:  no masses.   Lungs:  Normal respiratory effort, chest expands symmetrically. Lungs are clear to auscultation, no crackles or wheezes. Heart:  normal rate, regular rhythm, and no murmur.   Pulses:  2+ radial pulses Extremities:  no E/C/C Neurologic:  gait normal.   Skin:  color normal.   Cervical Nodes:  No lymphadenopathy noted Psych:  flat affect.     Impression & Recommendations:  Problem # 1:  COMMON MIGRAINE (ICD-346.10) Required visit to the ED last wk for intractable severe migraine which has resolved.  Having  ~2 mild HAs/ wk and 1 severe migraine q 6 mos.  Not a candidate for triptans with CAD hx.  On chronic NSAIDs and chronic narcotics for chronic back pain. Will add BB daily for both HTN and migraine prevention.  H/O given on migraine  prevention.  She may be a good candidate for botox injections.    The following medications were removed from the medication list:    Vicodin 5-500 Mg Tabs (Hydrocodone-acetaminophen) .Marland Kitchen... 1-2 tabs by mouth three times a day as needed severe pain Her updated medication list for this problem includes:    Mobic 15 Mg Tabs (Meloxicam) .Marland Kitchen... 1 tab by mouth daily w/ food    Percocet 5-325 Mg Tabs (Oxycodone-acetaminophen) .Marland Kitchen... 1 tab by mouth once daily per dr Jordan Likes    Metoprolol Tartrate 50 Mg Tabs (Metoprolol tartrate) .Marland Kitchen... 1 tab by mouth bid  Problem # 2:  ESSENTIAL HYPERTENSION, BENIGN (ICD-401.1)  Several HIGH BP readings.  Start on Metoprolol 50 mg two times a day for HTN, migraine prevention and CAD hx. RTC in 6 wks. Her updated medication list for this problem includes:    Metoprolol Tartrate 50 Mg Tabs (Metoprolol tartrate) .Marland Kitchen... 1 tab by mouth bid  BP today: 147/99 Prior BP: 156/114 (06/09/2009)  Labs Reviewed: K+: 4.5 (05/24/2009) Creat: : 0.68 (05/24/2009)   Chol: 267 (05/24/2009)   HDL: 36 (05/24/2009)   LDL: 167 (05/24/2009)   TG: 320 (05/24/2009)  Orders: Prescription Created Electronically [phone])  Problem # 3:  HYPERLIPIDEMIA (ICD-272.4) On Simvastatin for 1 month now.  Will repeat FLP with LFTs in 6 wks.  Seroquel/ wt gain unfortunately are not helping. Her updated medication list for this problem includes:    Simvastatin 40 Mg Tabs (Simvastatin) .Marland Kitchen... 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 Has OV with Urology in the next couple wks.  Complete Medication List: 1)  Protonix 40 Mg Tbec (Pantoprazole sodium) .... Take 1 tablet by mouth once a day 2)  Alprazolam 0.5 Mg Tabs (Alprazolam) .Marland Kitchen.. 1 tab by mouth  two times a day as needed anxiety 3)  Zoloft 100 Mg Tabs (Sertraline hcl) .... 1.5 tabs by mouth daily 4)  Cyclobenzaprine Hcl 10 Mg Tabs (Cyclobenzaprine hcl) .... Take 1 tablet by mouth once a day at bedtime as needed 5)  Seroquel 50 Mg Tabs (Quetiapine fumarate) .Marland Kitchen.. 1 tab by mouth qhs 6)  Gabapentin 600 Mg Tabs (Gabapentin) .Marland Kitchen.. 1 tab by mouth tid 7)  Mobic 15 Mg Tabs (Meloxicam) .Marland KitchenMarland KitchenMarland Kitchen 1  tab by mouth daily w/ food 8)  Promethazine Hcl 25 Mg Tabs (Promethazine hcl) .Marland Kitchen.. 1 tab by mouth q 6 hrs as needed nausea 9)  Lamictal  10)  Oxybutynin Chloride 5 Mg Tabs (Oxybutynin chloride) .Marland Kitchen.. 1 tab by mouth bid 11)  Simvastatin 40 Mg Tabs (Simvastatin) .Marland Kitchen.. 1 tab by mouth qhs 12)  Ambien 5 Mg Tabs (Zolpidem tartrate) .... 1/2 tab by mouth at bedtime as needed sleep 13)  Percocet 5-325 Mg Tabs (Oxycodone-acetaminophen) .Marland Kitchen.. 1 tab by mouth once daily per dr Jordan Likes 14)  Metoprolol Tartrate 50 Mg Tabs (Metoprolol tartrate) .Marland Kitchen.. 1 tab by mouth bid 15)  Proair Hfa 108 (90 Base) Mcg/act Aers (Albuterol sulfate) .... 2 puffs q 6 hrs prn  Patient Instructions: 1)  Add Metoprolol 50 mg 2 x a day for high BP and migraine prevention. 2)  Return for f/u migraines along with fasting labs in 6 wks. 3)  F/U with Dr Christell Constant and Merlene Morse. Prescriptions: PROAIR HFA 108 (90 BASE) MCG/ACT AERS (ALBUTEROL SULFATE) 2 puffs q 6 hrs prn  #2 x 1   Entered and Authorized by:   Seymour Bars DO   Signed by:   Seymour Bars DO on 06/19/2009   Method used:   Electronically to        [facility]* (retail)       [address] [place], [place]  [address]       Ph: [phone]       Fax: [phone]   RxID:   [id number] METOPROLOL TARTRATE 50 MG TABS (METOPROLOL TARTRATE) 1 tab by mouth bid  #60 x 2   Entered and Authorized by:   Seymour Bars DO   Signed by:   Seymour Bars DO on 06/19/2009   Method used:   Electronically to        [facility]* (retail)       [address] [place], [place]  [address]        Ph: [phone]       Fax: [phone]   RxID:   [id number]

## 2010-05-02 NOTE — Progress Notes (Signed)
Summary: Vomiting  Phone Note Call from Patient   Caller: Patient Summary of Call: Pt states she has been vomiting since Sat (on & off). I advised Pt to stay hydrated and try eating small amounts of bland foods. Pt has promethazine at home and will call back on Weds if no better.  Initial call taken by: Payton Spark CMA,  Aug 21, 2009 4:31 PM

## 2010-05-02 NOTE — Progress Notes (Signed)
Summary: Back and neck pain  Phone Note Call from Patient   Caller: Patient Summary of Call: After starting Lyrica, Pt c/o mid back and neck pain. Pt did not have this w/ neurontin . Please advise.  Initial call taken by: Payton Spark CMA,  April 12, 2009 12:18 PM  Follow-up for Phone Call        this is not a SE from the Lyrica.  I suggest that she stays on it and if continuing to c/o pain, needs to see pain clinic.   Follow-up by: Seymour Bars DO,  April 12, 2009 12:46 PM     Appended Document: Back and neck pain LMOM informing Pt of the above.

## 2010-05-02 NOTE — Letter (Signed)
Summary: [facility]   Imported By: Lanelle Bal 12/19/2009 13:23:30  _____________________________________________________________________  External Attachment:    Type:   Image     Comment:   External Document

## 2010-05-02 NOTE — Letter (Signed)
Summary: [facility]   Imported By: Lanelle Bal 06/20/2009 10:29:30  _____________________________________________________________________  External Attachment:    Type:   Image     Comment:   External Document

## 2010-05-02 NOTE — Consult Note (Signed)
Summary: [facility]   Imported By: Lanelle Bal 01/17/2010 10:14:38  _____________________________________________________________________  External Attachment:    Type:   Image     Comment:   External Document

## 2010-05-02 NOTE — Letter (Signed)
Summary: [facility]   Imported By: Lanelle Bal 06/12/2009 13:11:13  _____________________________________________________________________  External Attachment:    Type:   Image     Comment:   External Document

## 2010-05-02 NOTE — Assessment & Plan Note (Signed)
Summary: dizziness/ cholesterol   Vital Signs:  Patient profile:   40 year old female Height:      67 inches Weight:      172 pounds BMI:     27.04 O2 Sat:      98 % on Room air Temp:     98.3 degrees F Pulse rate:   85 / minute BP sitting:   128 / 87  (left arm) Cuff size:   regular  Vitals Entered By: Payton Spark CMA (December 06, 2009 9:29 AM)  O2 Flow:  Room air CC: F/U HTN. Also c/o dizziness, blurred vision and ankle swelling.   Primary Care Provider:  Seymour Bars DO  CC:  F/U HTN. Also c/o dizziness and blurred vision and ankle swelling.Marland Kitchen  History of Present Illness: 40 yo WF presents for feeling flushed and crampy on and off for a few weeks.  She c/o wt gain (but is down 11 lbs on our scale).  She c/o  swelling in her ankles at the end of the day.  She is on Metoprolol daily for HTN.  Her dizziness is described as rotational.  She has Nausea but no vomitting with these episodes.  It is sporadic.    She has been off her percocet for about 3 mos after chronic use.  She was referred to Dr Jordan Likes for chronic back pain and had a diskogram at her last visit.  She is supposed to f/u with Dr Channing Mutters but actually had seen Dr Venetia Maxon in Sept 2010 and felt to not be a surgical candidate..  She is taking Excedrin back and body.  It helps a little bit.  She is off Percocet because she failed 2 UDS test -- both + for THC.     Current Medications (verified): 1)  Protonix 40 Mg  Tbec (Pantoprazole Sodium) .... Take 1 Tablet By Mouth Once A Day 2)  Alprazolam 0.5 Mg  Tabs (Alprazolam) .Marland Kitchen.. 1 Tab By Mouth Two Times A Day As Needed Anxiety 3)  Zoloft 100 Mg Tabs (Sertraline Hcl) .... 1.5 Tabs By Mouth Daily 4)  Cyclobenzaprine Hcl 10 Mg  Tabs (Cyclobenzaprine Hcl) .... Take 1 Tablet By Mouth Once A Day At Bedtime As Needed 5)  Seroquel 50 Mg Tabs (Quetiapine Fumarate) .Marland Kitchen.. 1 Tab By Mouth Qhs 6)  Gabapentin 600 Mg Tabs (Gabapentin) .Marland Kitchen.. 1 Tab By Mouth Tid 7)  Mobic 15 Mg Tabs (Meloxicam)  .Marland Kitchen.. 1 Tab By Mouth Daily W/ Food 8)  Promethazine Hcl 25 Mg Tabs (Promethazine Hcl) .Marland Kitchen.. 1 Tab By Mouth Q 6 Hrs As Needed Nausea 9)  Lamictal 150 Mg Tabs (Lamotrigine) .... Take 1 Tab By Mouth Once Daily 10)  Oxybutynin Chloride 5 Mg Tabs (Oxybutynin Chloride) .Marland Kitchen.. 1 Tab By Mouth Bid 11)  Simvastatin 40 Mg Tabs (Simvastatin) .Marland Kitchen.. 1 Tab By Mouth Qhs 12)  Ambien 5 Mg Tabs (Zolpidem Tartrate) .... 1/2 Tab By Mouth At Bedtime As Needed Sleep 13)  Metoprolol Tartrate 50 Mg Tabs (Metoprolol Tartrate) .Marland Kitchen.. 1 Tab By Mouth Bid 14)  Proair Hfa 108 (90 Base) Mcg/act Aers (Albuterol Sulfate) .... 2 Puffs Q 6 Hrs Prn  Allergies (verified): 1)  ! * Lyrica 2)  ! Benadryl  Past History:  Past Medical History: Reviewed history from 11/14/2008 and no changes required. bipolar d/o-- Dr Christell Constant menopausal syndrome fibromyalgia CAD, 50% occlusion on cath 2006 (Dr Garnette Scheuermann) high chol premature menopause on HRT since 2006 G2P2002 chronic back pain GERD RLS L4-5 disc protrusion  with disc dessication and narrowing on MRI 2005; repeat 09-2008  Past Surgical History: Reviewed history from 03/19/2007 and no changes required. TAH with oophorectomy for DUB/ovarian cysts  Social History: Reviewed history from 03/19/2007 and no changes required. On disability for depression.  Used to work at [employer]. Finished HS.  Married to [place]. 2 teenage sons. Smokes 1 ppd x 10 yrs. 4 ETOH/ wk. Plays basketball and walks.  Review of Systems      See HPI  Physical Exam  General:  alert, well-developed, well-nourished, and well-hydrated.   Head:  normocephalic and atraumatic.   Eyes:  no nystagmus; PERRLA, wears glasses Ears:  both TMs dull and retracted Nose:  no rhinorrhea Mouth:  pharynx pink and moist.   Neck:  no masses.  full ROM Lungs:  Normal respiratory effort, chest expands symmetrically. Lungs are clear to auscultation, no crackles or wheezes. Heart:  Normal rate and regular rhythm. S1 and S2  normal without gallop, murmur, click, rub or other extra sounds. Abdomen:  soft, non-tender, normal bowel sounds, no distention, no masses, no guarding, no hepatomegaly, and no splenomegaly.   Pulses:  2+ radial pulses Extremities:  no UE or LE edema Neurologic:  neg Dix Hallpike Maneuver Skin:  sun damanged skin umbilical piercing Cervical Nodes:  No lymphadenopathy noted Psych:  good eye contact, not anxious appearing, and not depressed appearing.     Impression & Recommendations:  Problem # 1:  DIZZINESS (ICD-780.4) Questionable if this is eustachian tube dysfunction (likely) vs BPPV. Her Gilberto Better is negative and she has findings c/w eustachian tube dysfunction. Will treat with Lodrane samples in the AM and Dramamine as needed at night.  Call if not improved by Mon. Well hydrated and normotensive on meds. This may be a narcotic w/drawl SE.   Her updated medication list for this problem includes:    Promethazine Hcl 25 Mg Tabs (Promethazine hcl) .Marland Kitchen... 1 tab by mouth q 6 hrs as needed nausea    Lodrane 24 12 Mg Xr24h-cap (Brompheniramine maleate) .Marland Kitchen... 1-2 capsules by mouth once daily for allergies  Problem # 2:  EDEMA (ICD-782.3) None on exam but she c/o dependent edema.  Will update her CMP today to check albumin, renal and hepatic function. She is actually down 11 lbs.   Orders: T-Comprehensive Metabolic Panel ([id number])  Problem # 3:  BACK PAIN, LUMBAR (ICD-724.2) Chronic and off narcotics after failing her last 2 UDS with Dr Jordan Likes.  I will send a note back to Dr Jordan Likes re: her last visit with [facility].   The following medications were removed from the medication list:    Percocet 5-325 Mg Tabs (Oxycodone-acetaminophen) .Marland Kitchen... 1 tab by mouth once daily per dr Jordan Likes Her updated medication list for this problem includes:    Cyclobenzaprine Hcl 10 Mg Tabs (Cyclobenzaprine hcl) .Marland Kitchen... Take 1 tablet by mouth once a day at bedtime as needed    Mobic 15 Mg Tabs (Meloxicam)  .Marland Kitchen... 1 tab by mouth daily w/ food  Problem # 4:  HYPERLIPIDEMIA (ICD-272.4) She is due to recheck her LDL and LFTS for new start statin in Feb. Her updated medication list for this problem includes:    Simvastatin 40 Mg Tabs (Simvastatin) .Marland Kitchen... 1 tab by mouth qhs  Orders: T-LDL Direct ([id number])  Labs Reviewed: SGOT: 14 (05/24/2009)   SGPT: 13 (05/24/2009)   HDL:36 (05/24/2009)  LDL:167 (05/24/2009)  Chol:267 (05/24/2009)  Trig:320 (05/24/2009)  Complete Medication List: 1)  Protonix 40 Mg Tbec (Pantoprazole sodium) .... Take  1 tablet by mouth once a day 2)  Alprazolam 0.5 Mg Tabs (Alprazolam) .Marland Kitchen.. 1 tab by mouth two times a day as needed anxiety 3)  Zoloft 100 Mg Tabs (Sertraline hcl) .... 1.5 tabs by mouth daily 4)  Cyclobenzaprine Hcl 10 Mg Tabs (Cyclobenzaprine hcl) .... Take 1 tablet by mouth once a day at bedtime as needed 5)  Seroquel 50 Mg Tabs (Quetiapine fumarate) .Marland Kitchen.. 1 tab by mouth qhs 6)  Gabapentin 600 Mg Tabs (Gabapentin) .Marland Kitchen.. 1 tab by mouth tid 7)  Mobic 15 Mg Tabs (Meloxicam) .Marland Kitchen.. 1 tab by mouth daily w/ food 8)  Promethazine Hcl 25 Mg Tabs (Promethazine hcl) .Marland Kitchen.. 1 tab by mouth q 6 hrs as needed nausea 9)  Lamictal 150 Mg Tabs (Lamotrigine) .... Take 1 tab by mouth once daily 10)  Oxybutynin Chloride 5 Mg Tabs (Oxybutynin chloride) .Marland Kitchen.. 1 tab by mouth bid 11)  Simvastatin 40 Mg Tabs (Simvastatin) .Marland Kitchen.. 1 tab by mouth qhs 12)  Ambien 5 Mg Tabs (Zolpidem tartrate) .... 1/2 tab by mouth at bedtime as needed sleep 13)  Metoprolol Tartrate 50 Mg Tabs (Metoprolol tartrate) .Marland Kitchen.. 1 tab by mouth bid 14)  Proair Hfa 108 (90 Base) Mcg/act Aers (Albuterol sulfate) .... 2 puffs q 6 hrs prn 15)  Lodrane 24 12 Mg Xr24h-cap (Brompheniramine maleate) .Marland Kitchen.. 1-2 capsules by mouth once daily for allergies  Other Orders: Ophthalmology Referral (Ophthalmology) Admin 1st Vaccine ([id number]) Flu Vaccine 71yrs + [phone])  Patient Instructions: 1)  Update labs downstairs today to recheck  cholesterol and cause for swelling. 2)  Will get you into Dr Clearance Coots for an eye exam. 3)  Take Lodrane 2 capsules by mouth once daily for allergies (8 days given).  This should help w/ your dizziness. 4)  Add OTC Dramamine if needed for nighttime use. 5)  BP looks perfect. 6)  I will f/u with Dr Cyndia Diver office. 7)  REturn for follow up in 4 mos. Flu Vaccine Consent Questions     Do you have a history of severe allergic reactions to this vaccine? no    Any prior history of allergic reactions to egg and/or gelatin? no    Do you have a sensitivity to the preservative Thimersol? no    Do you have a past history of Guillan-Barre Syndrome? no    Do you currently have an acute febrile illness? no    Have you ever had a severe reaction to latex? no    Vaccine information given and explained to patient? yes    Are you currently pregnant? no    Lot Number:[id number]   Exp Date:09/29/2010   Site Given  Left Deltoid IMflu

## 2010-05-02 NOTE — Assessment & Plan Note (Signed)
Summary: [name] and now both side hurt- jr   Vital Signs:  Patient profile:   40 year old female Height:      67 inches Weight:      170 pounds BMI:     26.72 O2 Sat:      98 % on Room air Pulse rate:   83 / minute BP sitting:   140 / 92  (left arm) Cuff size:   regular  Vitals Entered By: Payton Spark CMA (March 05, 2010 11:42 AM)  O2 Flow:  Room air CC: [name] out of truck 2 weeks ago. Has L sided pain, vomiting and painful BM (on and off) since fall.     Primary Care Shoni Quijas:  Seymour Bars DO  CC:  [name] out of truck 2 weeks ago. Has L sided pain and vomiting and painful BM (on and off) since fall.  Marland Kitchen  History of Present Illness: 40 yo WF presents for a fall that occured 2 wks ago.  Her nephew got a truck that was high up and she was trying to climb in but she fell backwards onto the pavement.  Her sister caught her head.  She fell onto her L side.  She did not go to the ED.  She got up and went home.  She layed down and the next morning, she felt sore and had vomitting.  She felt constipated.  She had pain in her LLQ and the ribs on her L lower side.   Has pain with deep breathing.  She denies rectal bleeding or blood in her urine.  Denies problems voiding.  She no longer has N/V but she continues to have constipation and abd fullness.    Allergies: 1)  ! * Lyrica 2)  ! Benadryl  Past History:  Past Medical History: Reviewed history from 11/14/2008 and no changes required. bipolar d/o-- Dr Christell Constant menopausal syndrome fibromyalgia CAD, 50% occlusion on cath 2006 (Dr Garnette Scheuermann) high chol premature menopause on HRT since 2006 G2P2002 chronic back pain GERD RLS L4-5 disc protrusion with disc dessication and narrowing on MRI 2005; repeat 09-2008  Past Surgical History: Reviewed history from 03/19/2007 and no changes required. TAH with oophorectomy for DUB/ovarian cysts  Social History: Reviewed history from 03/19/2007 and no changes required. On disability for  depression.  Used to work at [employer]. Finished HS.  Married to [place]. 2 teenage sons. Smokes 1 ppd x 10 yrs. 4 ETOH/ wk. Plays basketball and walks.  Review of Systems      See HPI  Physical Exam  General:  alert, well-developed, well-nourished, and well-hydrated.   Head:  normocephalic and atraumatic.   Eyes:  sclera non icteric Mouth:  pharynx pink and moist.   Neck:  no masses.   Chest Wall:  L anterior and posterior lower ribs TTP but no bruising. Lungs:  Normal respiratory effort, chest expands symmetrically. Lungs are clear to auscultation, no crackles or wheezes.  splinting with deep inspiration Heart:  Normal rate and regular rhythm. S1 and S2 normal without gallop, murmur, click, rub or other extra sounds. Abdomen:  moderately distended with hypoactive BS and diffusely TTP with vol guarding.  No rigidity Msk:  full hip and L spine ROM.   Pulses:  2+ radial and pedal pulses Extremities:  no LE edema Skin:  no bruising or redness over the L side of the body Psych:  good eye contact, not anxious appearing, and not depressed appearing.     Impression & Recommendations:  Problem # 1:  NAUSEA AND VOMITING (ICD-787.01) I suspect this is related to her constipation given her hx and Phys exam findings.  I will check labs and an AAS on her today.  Likely to be unrelated to her fall.  Since she is not actively having N/V, I did not add any new meds today.  Suggested adding Miralax once daily for constipation and if not improving, will get her back in with her GI doctor. Orders: T-CBC w/Diff ([id number]) T-Comprehensive Metabolic Panel [id number]) T-Lipase ([id number]) T-DG ABD Acute w/Chest (44010)  Problem # 2:  RIB PAIN, LEFT SIDED (ICD-786.50) 2 wks after a fall backwards from a high truck.  She is splinting some and has pain but no bruising.  Suspect rib contusion but will include a CXR with abd films today. Orders: T-DG ABD Acute w/Chest (27253)  Complete Medication  List: 1)  Protonix 40 Mg Tbec (Pantoprazole sodium) .... Take 1 tablet by mouth once a day 2)  Alprazolam 0.5 Mg Tabs (Alprazolam) .Marland Kitchen.. 1 tab by mouth two times a day as needed anxiety 3)  Zoloft 100 Mg Tabs (Sertraline hcl) .... 1.5 tabs by mouth daily 4)  Cyclobenzaprine Hcl 10 Mg Tabs (Cyclobenzaprine hcl) .... Take 1 tablet by mouth once a day at bedtime as needed 5)  Seroquel 50 Mg Tabs (Quetiapine fumarate) .Marland Kitchen.. 1 tab by mouth qhs 6)  Gabapentin 600 Mg Tabs (Gabapentin) .Marland Kitchen.. 1 tab by mouth tid 7)  Mobic 15 Mg Tabs (Meloxicam) .Marland Kitchen.. 1 tab by mouth daily w/ food 8)  Promethazine Hcl 25 Mg Tabs (Promethazine hcl) .Marland Kitchen.. 1 tab by mouth q 6 hrs as needed nausea 9)  Lamictal 150 Mg Tabs (Lamotrigine) .... Take 1 tab by mouth once daily 10)  Oxybutynin Chloride 5 Mg Tabs (Oxybutynin chloride) .Marland Kitchen.. 1 tab by mouth bid 11)  Crestor 20 Mg Tabs (Rosuvastatin calcium) .Marland Kitchen.. 1 tab by mouth qhs 12)  Ambien 5 Mg Tabs (Zolpidem tartrate) .... 1/2 tab by mouth at bedtime as needed sleep 13)  Metoprolol Tartrate 50 Mg Tabs (Metoprolol tartrate) .Marland Kitchen.. 1 tab by mouth bid 14)  Proair Hfa 108 (90 Base) Mcg/act Aers (Albuterol sulfate) .... 2 puffs q 6 hrs prn 15)  Lodrane 24 12 Mg Xr24h-cap (Brompheniramine maleate) .Marland Kitchen.. 1-2 capsules by mouth once daily for allergies  Patient Instructions: 1)  Return downstairs any time after 1 pm today for XRAY and LABS.   2)  Will call you w/ results as soon as I get them. 3)  For constipation:  use Miralax once daily until bowels regulate.   Orders Added: 1)  T-CBC w/Diff [[id number]] 2)  T-Comprehensive Metabolic Panel [[id number]] 3)  T-Lipase [[id number]] 4)  T-DG ABD Acute w/Chest [74022] 5)  Est. Patient Level IV [59563]

## 2010-05-02 NOTE — Miscellaneous (Signed)
  Clinical Lists Changes  Medications: Changed medication from NEURONTIN 300 MG CAPS (GABAPENTIN) 1 capsule by mouth at bedtime x 1 wk then 2 capsules by mouth qhs to GABAPENTIN 600 MG TABS (GABAPENTIN) 1 tab by mouth tid

## 2010-05-02 NOTE — Letter (Signed)
Summary: [facility]  [facility]   Imported By: Lanelle Bal 09/05/2009 11:54:08  _____________________________________________________________________  External Attachment:    Type:   Image     Comment:   External Document

## 2010-05-02 NOTE — Letter (Signed)
Summary: [facility]  [facility]   Imported By: Lanelle Bal 02/26/2010 10:50:04  _____________________________________________________________________  External Attachment:    Type:   Image     Comment:   External Document

## 2010-05-02 NOTE — Assessment & Plan Note (Signed)
Summary: ?UTI/ change to Lyrica   Vital Signs:  Patient profile:   40 year old female Height:      67 inches Weight:      170 pounds BMI:     26.72 O2 Sat:      96 % on Room air Temp:     98.5 degrees F oral Pulse rate:   87 / minute BP sitting:   123 / 82  (left arm) Cuff size:   regular  Vitals Entered By: Payton Spark CMA (April 03, 2009 9:22 AM)  O2 Flow:  Room air CC: Urinary problems.    Primary Care Provider:  Seymour Bars DO  CC:  Urinary problems. .  History of Present Illness: 40 yo WF presents for dysuria x 5 days with urgency and frequency.  She has chronic problems with OAB and incontinence.  She has some urge and stress incontinence.  She has been coughing for the past 2 wks. She is a smoker and has some increased DOE and chest tightness.  No sputum.   She has been sweaty but no fevers or chills.  Has some suprapubic pain.  She has not noticed any vaginal discharge.  She drinks 4 16 oz sodas/ day.    Pap smear last year 2009 at [facility]. MRI L spine earlier in 2010 ruled out cauda equina syndrome.  Has had OAB symptoms for months but was never tried on meds.  She is not seeing pain mgmt for her chronic LBP/ fibromyalgia and refused to f/u with Neurosurgery.  She is on Neurontin but doesn't feel lke it's helping much.  Wants to try Lyrica.          Allergies: 1)  ! * Lyrica  Past History:  Past Medical History: Reviewed history from 11/14/2008 and no changes required. bipolar d/o-- Dr Christell Constant menopausal syndrome fibromyalgia CAD, 50% occlusion on cath 2006 (Dr Garnette Scheuermann) high chol premature menopause on HRT since 2006 G2P2002 chronic back pain GERD RLS L4-5 disc protrusion with disc dessication and narrowing on MRI 2005; repeat 09-2008  Past Surgical History: Reviewed history from 03/19/2007 and no changes required. TAH with oophorectomy for DUB/ovarian cysts  Social History: Reviewed history from 03/19/2007 and no changes required. On  disability for depression.  Used to work at [employer]. Finished HS.  Married to [place]. 2 teenage sons. Smokes 1 ppd x 10 yrs. 4 ETOH/ wk. Plays basketball and walks.  Review of Systems      See HPI  Physical Exam  General:  alert, well-developed, well-nourished, well-hydrated, and overweight-appearing.   Head:  normocephalic and atraumatic.   Eyes:  sclera non icteric wearing glasses Nose:  no nasal discharge.   Mouth:  clear postnasal drip; throat is mildly injected Neck:  no masses.   Lungs:  Normal respiratory effort, chest expands symmetrically. Lungs are clear to auscultation, no crackles or wheezes.  Dry cough.   Heart:  Normal rate and regular rhythm. S1 and S2 normal without gallop, murmur, click, rub or other extra sounds. Abdomen:  suprapubic TTP w/o R/G/R.  L CVAT.  soft.  ND.  NABS. Extremities:  no LE edema Skin:  color normal.   Cervical Nodes:  No lymphadenopathy noted Psych:  flat affect.     Impression & Recommendations:  Problem # 1:  UTI (ICD-599.0) UA is equivocal for infection with trace blood and LE.  Consider IC as dx given her hx. Treat with 5 days of Levaquin which also covers her URI/ atypical  PNA symptoms. Call if dysuria is not improving, o/w repeat UA with cx at 3 wk f/u and if still symptomatic, set up with urology. Her updated medication list for this problem includes:    Levaquin 750 Mg Tabs (Levofloxacin) .Marland Kitchen... 1 tab by mouth daily x 5 days    Detrol La 4 Mg Xr24h-cap (Tolterodine tartrate) .Marland Kitchen... 1 capsule by mouth daily  Problem # 2:  OVERACTIVE BLADDER (ICD-596.51) Start Detrol LA 4 mg daily for OAB symptoms. Avoid caffeine. F/U in 3 wks.  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  F/U in 3 wks. Not on chronic narcotics.  Failed to comply with NS recommendations.  Problem # 4:  CIGARETTE SMOKER (ICD-305.1) Smoker with URI, prolonged cough. Encouraged cessation. Given cough symptoms, ProAir HFA sample  given w/ instructions. Set up for PFTs this year.  Complete Medication List: 1)  Protonix 40 Mg Tbec (Pantoprazole sodium) .... Take 1 tablet by mouth once a day 2)  Alprazolam 0.5 Mg Tabs (Alprazolam) .Marland Kitchen.. 1 tab by mouth two times a day as needed anxiety 3)  Zoloft 100 Mg Tabs (Sertraline hcl) .... 1.5 tabs by mouth daily 4)  Cyclobenzaprine Hcl 10 Mg Tabs (Cyclobenzaprine hcl) .... Take 1 tablet by mouth once a day at bedtime as needed 5)  Seroquel 50 Mg Tabs (Quetiapine fumarate) .Marland Kitchen.. 1 tab by mouth qhs 6)  Lyrica 75 Mg Caps (Pregabalin) .Marland Kitchen.. 1 capsule by mouth at bedtime x 1 wk then 1 capsule by mouth bid 7)  Mobic 15 Mg Tabs (Meloxicam) .Marland Kitchen.. 1 tab by mouth daily w/ food 8)  Tramadol Hcl 50 Mg Tabs (Tramadol hcl) .Marland Kitchen.. 1 tab by mouth two times a day as needed pain 9)  Promethazine Hcl 25 Mg Tabs (Promethazine hcl) .Marland Kitchen.. 1 tab by mouth q 6 hrs as needed nausea 10)  Lamictal  11)  Levaquin 750 Mg Tabs (Levofloxacin) .Marland Kitchen.. 1 tab by mouth daily x 5 days 12)  Detrol La 4 Mg Xr24h-cap (Tolterodine tartrate) .Marland Kitchen.. 1 capsule by mouth daily  Other Orders: UA Dipstick w/o Micro (automated)  (81003)  Patient Instructions: 1)  Take 5 days of Levaquin for both UTI and prolonged cough. 2)  Use the Inhaler 2 puffs 4 x a day for the next wk and avoid smoking. 3)  Avoid caffeine. 4)  Change Neurontin (gabapentin) to Lyrica.  Begin 75 mg of Lyrica tonight.   5)  Start Detrol LA once daily for overactive bladder. 6)  Follow up in 3 wks to see if symptoms are improving. Prescriptions: DETROL LA 4 MG XR24H-CAP (TOLTERODINE TARTRATE) 1 capsule by mouth daily  #30 x 1   Entered and Authorized by:   Seymour Bars DO   Signed by:   Seymour Bars DO on 04/03/2009   Method used:   Electronically to        [facility]* (retail)       [address] [place], [place]  [address]       Ph: [phone]       Fax: [phone]   RxID:   [id number] LEVAQUIN 750 MG TABS (LEVOFLOXACIN) 1 tab by mouth daily  x 5 days  #5 tabs x 0   Entered and Authorized by:   Seymour Bars DO   Signed by:   Seymour Bars DO on 04/03/2009   Method used:   Electronically to        [facility]  [address] (retail)       [address] [place], [place]  [address]       Ph: [phone]       Fax: [phone]   RxID:   [id number] LYRICA 75 MG CAPS (PREGABALIN) 1 capsule by mouth at bedtime x 1 wk then 1 capsule by mouth bid  #60 x 2   Entered and Authorized by:   Seymour Bars DO   Signed by:   Seymour Bars DO on 04/03/2009   Method used:   Printed then faxed to ...       [facility]* (retail)       [address] [place], [place]  [address]       Ph: [phone]       Fax: [phone]   RxID:   [id number]    Orders Added: 1)  UA Dipstick w/o Micro (automated)  [81003] 2)  Est. Patient Level IV [88416]  Laboratory Results   Urine Tests    Routine Urinalysis   Color: yellow Appearance: Clear Glucose: negative   (Normal Range: Negative) Bilirubin: negative   (Normal Range: Negative) Ketone: negative   (Normal Range: Negative) Spec. Gravity: 1.020   (Normal Range: 1.003-1.035) Blood: trace-intact   (Normal Range: Negative) pH: 6.5   (Normal Range: 5.0-8.0) Protein: negative   (Normal Range: Negative) Urobilinogen: 0.2   (Normal Range: 0-1) Nitrite: negative   (Normal Range: Negative) Leukocyte Esterace: trace   (Normal Range: Negative)

## 2010-05-02 NOTE — Progress Notes (Signed)
Summary: C/o pain  Phone Note Call from Patient   Caller: Patient Summary of Call: Pt called again [facility] c/o back pain and stating meds are not working. Is it OK to go ahead and refer to pain clinic? Initial call taken by: Payton Spark CMA,  April 13, 2009 8:45 AM  Follow-up for Phone Call        yes, please do so.  Start with Dr Jordan Likes. Follow-up by: Seymour Bars DO,  April 13, 2009 10:16 AM     Appended Document: C/o pain LMOM informing Pt of the above.

## 2010-05-02 NOTE — Assessment & Plan Note (Signed)
Summary: headache x 3 dys rm 1   Vital Signs:  Patient Profile:   40 Years Old Female CC:      headache x 3 dys  Height:     67 inches Weight:      168 pounds O2 Sat:      97 % O2 treatment:    Room Air Temp:     97.6 degrees F oral Pulse rate:   118 / minute Pulse rhythm:   irregular Resp:     16 per minute BP sitting:   156 / 114  (right arm) Cuff size:   regular  Vitals Entered By: Areta Haber CMA (June 09, 2009 4:20 PM)                  Current Allergies: ! * LYRICA ! BENADRYL    History of Present Illness Chief Complaint: headache x 3 dys  History of Present Illness: Subjective:  Patient complains of developing a mild occipital headache 3 days ago that then progressed into a typical generalized migraine headache.  She states that she has a similar headache once or twice yearly.  She has been having nausea/vomiting.  No fevers, chills, and sweats.  No respiratory, GU, or GYN symptoms.  No localizing neuro symptoms.  Headache did not respond to Excedrin Migraine.  She tried a Phenergan tab this morning but vomited afterwards.  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) PANIC ATTACK (ICD-300.01) BACK PAIN, LUMBAR (ICD-724.2) BIPOLAR DISORDER UNSPECIFIED (ICD-296.80) WEAKNESS, LEFT SIDE OF BODY (ICD-342.90) HYPERLIPIDEMIA (ICD-272.4)   Current Meds PROTONIX 40 MG  TBEC (PANTOPRAZOLE SODIUM) Take 1 tablet by mouth once a day ALPRAZOLAM 0.5 MG  TABS (ALPRAZOLAM) 1 tab by mouth two times a day as needed anxiety ZOLOFT 100 MG TABS (SERTRALINE HCL) 1.5 tabs by mouth daily CYCLOBENZAPRINE HCL 10 MG  TABS (CYCLOBENZAPRINE HCL) Take 1 tablet by mouth once a day at bedtime as  needed SEROQUEL 50 MG TABS (QUETIAPINE FUMARATE) 1 tab by mouth qhs GABAPENTIN 600 MG TABS (GABAPENTIN) 1 tab by mouth tid MOBIC 15 MG TABS (MELOXICAM) 1 tab by mouth daily w/ food PROMETHAZINE HCL 25 MG TABS (PROMETHAZINE HCL) 1 tab by mouth q 6 hrs as needed nausea * LAMICTAL  VICODIN 5-500 MG TABS (HYDROCODONE-ACETAMINOPHEN) 1-2 tabs by mouth three times a day as needed severe pain OXYBUTYNIN CHLORIDE 5 MG TABS (OXYBUTYNIN CHLORIDE) 1 tab by mouth bid SIMVASTATIN 40 MG TABS (SIMVASTATIN) 1 tab by mouth qhs PROMETHAZINE HCL 25 MG SUPP (PROMETHAZINE HCL) One PR q4 to 6hr as needed for nausea/vomiting  REVIEW OF SYSTEMS Constitutional Symptoms      Denies fever, chills, night sweats, weight loss, weight gain, and fatigue.  Eyes       Denies change in vision, eye pain, eye discharge, glasses, contact lenses, and eye surgery. Ear/Nose/Throat/Mouth       Denies hearing loss/aids, change in hearing, ear pain, ear discharge, dizziness, frequent runny nose, frequent nose bleeds, sinus problems, sore throat, hoarseness, and tooth pain or bleeding.  Respiratory       Denies dry cough, productive cough, wheezing, shortness of breath, asthma, bronchitis, and emphysema/COPD.  Cardiovascular       Denies murmurs, chest pain, and tires easily with exhertion.    Gastrointestinal       Complains of nausea/vomiting.  Denies stomach pain, diarrhea, constipation, blood in bowel movements, and indigestion.      Comments: x 3 dys Genitourniary       Denies painful urination, kidney stones, and loss of urinary control. Neurological       Complains of headaches.      Denies paralysis, seizures, and fainting/blackouts. Musculoskeletal       Denies muscle pain, joint pain, joint stiffness, decreased range of motion, redness, swelling, muscle weakness, and gout.  Skin       Denies bruising, unusual mles/lumps or sores, and hair/skin or nail changes.  Psych       Denies mood changes, temper/anger issues,  anxiety/stress, speech problems, depression, and sleep problems. Other Comments: Pt has not seen PCP for this.   Past History:  Past Medical History: Last updated: 11/14/2008 bipolar d/o-- Dr Christell Constant menopausal syndrome fibromyalgia CAD, 50% occlusion on cath 2006 (Dr Garnette Scheuermann) high chol premature menopause on HRT since 2006 G2P2002 chronic back pain GERD RLS L4-5 disc protrusion with disc dessication and narrowing on MRI 2005; repeat 09-2008  Past Surgical History: Last updated: 03/19/2007 TAH with oophorectomy for DUB/ovarian cysts  Family History: Last updated: 03/20/2007 father AMI in 24's, ETOH mother stroke at 78, DVT, high chol sister healthy MOM-DVT,PAD GRANDMA AND COUSIN-LUPUS  Social History: Last updated: 03/19/2007 On disability for depression.  Used to work at [employer]. Finished HS.  Married to [place]. 2 teenage sons. Smokes 1 ppd x 10 yrs. 4 ETOH/ wk. Plays basketball and walks.  Risk Factors: Caffeine Use: 5+ (03/19/2007)  Risk Factors: Smoking Status: current (03/19/2007) Packs/Day: 1 (03/19/2007)   Objective:  Patient appears uncomfortable but in no acute distress  Eyes:  Pupils are equal, round, and reactive to light and accomdation.  Extraocular movement is intact.  Conjunctivae are not inflamed.  Fundi benign.  Mild photophobia Ears:  Canals normal.  Tympanic membranes normal.   Nose:  Normal septum.  Normal turbinates, mildly congested.    No sinus tenderness present.  Mouth:  Decreased moisture Pharynx:  Normal  Neck:  Supple.  No adenopathy is present.  Lungs:  Clear to auscultation.  Breath sounds are equal.  Heart:  Regular rate and rhythm without murmurs, rubs, or gallops.  Abdomen:  Nontender without masses or hepatosplenomegaly.  Bowel sounds are present.  No CVA or flank tenderness.  Neurologic:  Cranial nerves 2 through 12 are normal.  Patellar and elbow reflexes are normal.  Cerebellar function is intact.    Assessment New  Problems: NAUSEA AND VOMITING (ICD-787.01) COMMON MIGRAINE (ICD-346.10)   Plan New Medications/Changes: PROMETHAZINE HCL 25 MG SUPP (PROMETHAZINE HCL) One PR q4 to 6hr as needed for nausea/vomiting  #6 x 0, 06/09/2009, Donna Christen MD  New Orders: New Patient Level III [phone] Promethazine up to 50mg  [J2550] Ketorolac-Toradol 15mg  [J1885] Admin of Therapeutic Inj  intramuscular or subcutaneous [96372] Planning Comments:   Unsuccessfully attempted IV Toradol 60mg  IM.  Promethazine 25mg  IM. Rx for Promethazine 25mg  suppositories. Clear liquids tonight then slowly advance. Return for worsening symptoms (to ER at night).   Follow-up with PCP if not improving.   The patient and/or caregiver has been counseled thoroughly with regard to medications prescribed including dosage, schedule, interactions, rationale for use, and possible side effects and they verbalize understanding.  Diagnoses and expected course of recovery discussed and will return if not improved as expected or if the condition worsens. Patient and/or caregiver verbalized understanding.  Prescriptions: PROMETHAZINE HCL 25 MG SUPP (PROMETHAZINE HCL) One PR  q4 to 6hr as needed for nausea/vomiting  #6 x 0   Entered and Authorized by:   Donna Christen MD   Signed by:   Donna Christen MD on 06/09/2009   Method used:   Print then Give to Patient   RxID:   [id number]    Medication Administration  Injection # 1:    Medication: Ketorolac-Toradol 15mg     Diagnosis: NAUSEA AND VOMITING (ICD-787.01)    Route: IM    Site: RUOQ gluteus    Exp Date: 12/31/2010    Lot #: [id number]    Mfr: Hospira    Comments: Administered 60mg     Patient tolerated injection without complications    Given by: Areta Haber CMA (June 09, 2009 6:42 PM)  Injection # 2:    Medication: Promethazine up to 50mg     Diagnosis: NAUSEA AND VOMITING (ICD-787.01)    Route: IM    Site: LUOQ gluteus    Exp Date: 03/31/2010    Lot #: [id number]     Mfr: Baxter    Comments: Administered 25mg     Patient tolerated injection without complications    Given by: Areta Haber CMA (June 09, 2009 6:44 PM)  Orders Added: 1)  New Patient Level III [99203] 2)  Promethazine up to 50mg  [J2550] 3)  Ketorolac-Toradol 15mg  [J1885] 4)  Admin of Therapeutic Inj  intramuscular or subcutaneous [25956]

## 2010-05-02 NOTE — Letter (Signed)
Summary: [facility]  [facility]   Imported By: Lanelle Bal 07/11/2009 12:15:04  _____________________________________________________________________  External Attachment:    Type:   Image     Comment:   External Document

## 2010-05-02 NOTE — Letter (Signed)
Summary: [facility]  [facility]   Imported By: [name] 05/12/2009 11:20:17  _____________________________________________________________________  External Attachment:    Type:   Image     Comment:   External Document

## 2010-05-02 NOTE — Assessment & Plan Note (Signed)
Summary: f/u back pain/ fibromyalgia   Vital Signs:  Patient profile:   40 year old female Height:      67 inches Weight:      178 pounds BMI:     27.98 O2 Sat:      97 % on Room air Temp:     98.5 degrees F oral Pulse rate:   82 / minute BP sitting:   121 / 85  (left arm) Cuff size:   regular  Vitals Entered By: Payton Spark CMA (April 24, 2009 1:09 PM)  O2 Flow:  Room air CC: F/U.   Primary Care Provider:  Seymour Bars DO  CC:  F/U.Marland Kitchen  History of Present Illness: 40 yo WF presents for f/u.   1. OAB improved with Detrol LA. Previous symptoms, burning and pain with urination, gone.  Still experiencing urinary incontinence, especially at night. May be d/t stress or questionable overflow w/o urgency. She is drinking 3 caffeinated drinks/day. She has increased her daily intake of water.   2.  LBP with DDD, stenosis, No radicular symptoms. Complaining of pain in the mid and lower back and the neck. She denies that the pain radiates.   3. Fibromyalgia - Lyrica two times a day not helping, she takes one in the late afternoon and the second at bednight. Pain in the knees and hips bilaterally. On Mobic, Flexeril, Tramadol.    4.  Pt. states that she is always hungry. She is gaining wt. She is smoking more (1pk/day). She reports an increase in her stress level over the last two months. Her son has moved in with her and she identifies him as the source of her stress. She is seeing Dr Christell Constant + counselors for bipolar d/o (on Seroquel).          Current Medications (verified): 1)  Protonix 40 Mg  Tbec (Pantoprazole Sodium) .... Take 1 Tablet By Mouth Once A Day 2)  Alprazolam 0.5 Mg  Tabs (Alprazolam) .Marland Kitchen.. 1 Tab By Mouth Two Times A Day As Needed Anxiety 3)  Zoloft 100 Mg Tabs (Sertraline Hcl) .... 1.5 Tabs By Mouth Daily 4)  Cyclobenzaprine Hcl 10 Mg  Tabs (Cyclobenzaprine Hcl) .... Take 1 Tablet By Mouth Once A Day At Bedtime As Needed 5)  Seroquel 50 Mg Tabs (Quetiapine Fumarate)  .Marland Kitchen.. 1 Tab By Mouth Qhs 6)  Lyrica 75 Mg Caps (Pregabalin) .Marland Kitchen.. 1 Capsule By Mouth At Bedtime X 1 Wk Then 1 Capsule By Mouth Bid 7)  Mobic 15 Mg Tabs (Meloxicam) .Marland Kitchen.. 1 Tab By Mouth Daily W/ Food 8)  Tramadol Hcl 50 Mg Tabs (Tramadol Hcl) .Marland Kitchen.. 1 Tab By Mouth Two Times A Day As Needed Pain 9)  Promethazine Hcl 25 Mg Tabs (Promethazine Hcl) .Marland Kitchen.. 1 Tab By Mouth Q 6 Hrs As Needed Nausea 10)  Lamictal 11)  Detrol La 4 Mg Xr24h-Cap (Tolterodine Tartrate) .Marland Kitchen.. 1 Capsule By Mouth Daily  Allergies (verified): 1)  ! * Lyrica  Past History:  Past Medical History: Reviewed history from 11/14/2008 and no changes required. bipolar d/o-- Dr Christell Constant menopausal syndrome fibromyalgia CAD, 50% occlusion on cath 2006 (Dr Garnette Scheuermann) high chol premature menopause on HRT since 2006 G2P2002 chronic back pain GERD RLS L4-5 disc protrusion with disc dessication and narrowing on MRI 2005; repeat 09-2008  Social History: Reviewed history from 03/19/2007 and no changes required. On disability for depression.  Used to work at [employer]. Finished HS.  Married to [place]. 2 teenage sons. Smokes 1 ppd x  10 yrs. 4 ETOH/ wk. Plays basketball and walks.  Review of Systems      See HPI  Physical Exam  General:  alert, normal appearance, cooperative to examination, and overweight-appearing.   Head:  normocephalic and atraumatic.   Mouth:  pharynx pink and moist.   Neck:  no masses.   Lungs:  normal respiratory effort, no accessory muscle use, and normal breath sounds.   Heart:  normal rate, regular rhythm, and no murmur.   Abdomen:  soft, non-tender, and normal bowel sounds.  ND.   Msk:  tenderness midline over thoracic and lumbar vertebrae; active L spine flexion to 90 deg with pain.  Limited L spine extension to 10 deg with pain.  gait normal.  Limited thoracic spine SB and rotation.    Pulses:  R radial normal and L radial normal.   Extremities:  no LE edema Neurologic:  alert & oriented X3. Skin:  color  normal.   Cervical Nodes:  No lymphadenopathy noted Psych:  Oriented X3, good eye contact, depressed affect, and subdued.      Impression & Recommendations:  Problem # 1:  OVERACTIVE BLADDER (ICD-596.51) Detrol LA effective in relieving symptoms. Detrol LA changed to Oxybutynin due to cost. Non complaint with continued caffiene use. Still having urinary incontinence -- ? interstitial cystitis.  Will have urology evaluate. 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 Mild L4-L5, Signficant L5-S1 degeneration.  Failed to comply with NS recommendations of doing PT. Symptoms continue on RX NSAIDs, flexeril, tramadol.   One time presription for Vicodin given.  Appt. with Dr. Jordan Likes for pain management 05/04/09. Pain to see back in 3 months. Will evaluate then for need for physical therapy or aqua therapy.   Problem # 3:  FIBROMYALGIA (ICD-729.1) Changed lyrica to neurotin because she states lyrica was ineffective. Adding Vicodin for short term use.   Appt. with Dr. Jordan Likes for pain management 05/04/09.  The following medications were removed from the medication list:    Tramadol Hcl 50 Mg Tabs (Tramadol hcl) .Marland Kitchen... 1 tab by mouth two times a day as needed pain Her updated medication list for this problem includes:    Cyclobenzaprine Hcl 10 Mg Tabs (Cyclobenzaprine hcl) .Marland Kitchen... Take 1 tablet by mouth once a day at bedtime as needed    Mobic 15 Mg Tabs (Meloxicam) .Marland Kitchen... 1 tab by mouth daily w/ food    Vicodin 5-500 Mg Tabs (Hydrocodone-acetaminophen) .Marland Kitchen... 1-2 tabs by mouth three times a day as needed severe pain  Problem # 4:  BIPOLAR DISORDER UNSPECIFIED (ICD-296.80) Psych managed by Dr. Christell Constant.  Encouraged her to stop smoking.  Plan to follow up regarding weight changes (recent gain of 50lbs). Will be checking lipids and fasting glucose given recent wt gain from seroquel.  Labs ordered: Cholesterol and fasting blood sugar.     Complete Medication List: 1)  Protonix 40 Mg Tbec (Pantoprazole sodium) .... Take 1 tablet by mouth once a day 2)  Alprazolam 0.5 Mg Tabs (Alprazolam) .Marland Kitchen.. 1 tab by mouth two times a day as needed anxiety 3)  Zoloft 100 Mg Tabs (Sertraline hcl) .... 1.5 tabs by mouth daily 4)  Cyclobenzaprine Hcl 10 Mg Tabs (Cyclobenzaprine hcl) .... Take 1 tablet by mouth once a day at bedtime as needed 5)  Seroquel 50 Mg Tabs (Quetiapine fumarate) .Marland Kitchen.. 1 tab by mouth qhs 6)  Neurontin 300 Mg Caps (Gabapentin) .Marland Kitchen.. 1 capsule by mouth at bedtime x  1 wk then 2 capsules by mouth qhs 7)  Mobic 15 Mg Tabs (Meloxicam) .Marland Kitchen.. 1 tab by mouth daily w/ food 8)  Promethazine Hcl 25 Mg Tabs (Promethazine hcl) .Marland Kitchen.. 1 tab by mouth q 6 hrs as needed nausea 9)  Lamictal  10)  Vicodin 5-500 Mg Tabs (Hydrocodone-acetaminophen) .Marland Kitchen.. 1-2 tabs by mouth three times a day as needed severe pain 11)  Oxybutynin Chloride 5 Mg Tabs (Oxybutynin chloride) .Marland Kitchen.. 1 tab by mouth bid  Other Orders: T-Comprehensive Metabolic Panel [id number]) T-Lipid Profile ([id number])  Patient Instructions: 1)  Change Lyrica to Neurontin. 2)  Change Detrol LA to Oxybutynin for OAB. 3)  Will get you in with urology. 4)  You have appt with Dr Jordan Likes for pain management. 5)  Update your fasting labs. 6)  F/U with me in 3 mos. Prescriptions: OXYBUTYNIN CHLORIDE 5 MG TABS (OXYBUTYNIN CHLORIDE) 1 tab by mouth bid  #60 x 1   Entered and Authorized by:   Seymour Bars DO   Signed by:   Seymour Bars DO on 04/24/2009   Method used:   Electronically to        [facility]* (retail)       [address] [place], [place]  [address]       Ph: [phone]       Fax: [phone]   RxID:   [id number] VICODIN 5-500 MG TABS (HYDROCODONE-ACETAMINOPHEN) 1-2 tabs by mouth three times a day as needed severe pain  #40 x 0   Entered and Authorized by:   Seymour Bars DO   Signed by:   Seymour Bars DO on 04/24/2009   Method used:   Printed then faxed to  ...       [facility]* (retail)       [address] [place], [place]  [address]       Ph: [phone]       Fax: [phone]   RxID:   [id number] NEURONTIN 300 MG CAPS (GABAPENTIN) 1 capsule by mouth at bedtime x 1 wk then 2 capsules by mouth qhs  #60 x 1   Entered and Authorized by:   Seymour Bars DO   Signed by:   Seymour Bars DO on 04/24/2009   Method used:   Electronically to        [facility]* (retail)       [address] [place], [place]  [address]       Ph: [phone]       Fax: [phone]   RxID:   [id number]

## 2010-05-02 NOTE — Letter (Signed)
Summary: [facility]  [facility]   Imported By: Lanelle Bal 05/12/2009 11:26:48  _____________________________________________________________________  External Attachment:    Type:   Image     Comment:   External Document

## 2010-05-02 NOTE — Letter (Signed)
Summary: Primary Care Consult Scheduled Letter  [place] [facility]  [address]   [place], [place] [address]   Phone: [phone]  Fax: [phone]      04/25/2009 MRN: [id number]  [facility] [address] [place], [place]  [address]    Dear Ms. Savarino,      We have scheduled an appointment for you.  At the recommendation of Dr.BOWEN, we have scheduled you a consult with [facility], [place] ,  DR Malen Gauze on May 23 2009 at [facility].  Their address is_[address]  [place]. The office phone number is [phone].  If this appointment day and time is not convenient for you, please feel free to call the office of the doctor you are being referred to at the number listed above and reschedule the appointment.     It is important for you to keep your scheduled appointments. We are here to make sure you are given good patient care. If you have questions or you have made changes to your appointment, please notify us at  [phone], ask for HELEN.    Thank you,  Patient Care Coordinator [place] [facility]

## 2010-05-02 NOTE — Letter (Signed)
Summary: [facility]  [facility]   Imported By: Lanelle Bal 07/26/2009 09:03:38  _____________________________________________________________________  External Attachment:    Type:   Image     Comment:   External Document

## 2010-05-02 NOTE — Progress Notes (Signed)
Summary: Trouble walking  Phone Note Call from Patient   Caller: Patient Summary of Call: Pt called a few days ago c/o vomiting. Pt calls back today stating vomiting is gone but is having trouble walking and also has a pain behind her ears and back of head. Please advise. Initial call taken by: Payton Spark CMA,  Aug 24, 2009 1:42 PM  Follow-up for Phone Call        Patient should be evaluated in the ED if she is having difficulty walking. Follow-up by: Lemont Fillers FNP,  Aug 24, 2009 1:49 PM  Additional Follow-up for Phone Call Additional follow up Details #1::        Pt aware and will go to ED Additional Follow-up by: Payton Spark CMA,  Aug 24, 2009 1:57 PM

## 2010-05-03 NOTE — Assessment & Plan Note (Signed)
Summary: f/u HTN   Vital Signs:  Patient profile:   40 year old female Height:      67 inches Weight:      168 pounds BMI:     26.41 O2 Sat:      98 % on Room air Pulse rate:   86 / minute BP sitting:   157 / 101  (left arm) Cuff size:   regular  Vitals Entered By: Payton Spark CMA (April 06, 2010 10:30 AM)  O2 Flow:  Room air CC: F/U.    Primary Care Provider:  Seymour Bars DO  CC:  F/U. Marland Kitchen  History of Present Illness: 40 yo WF presents for f/u visit.  She broke her L arm on Christmas day and is in a cast.  She is f/u her HTN today.  On metoprolol 50 mg two times a day.  Continues to smoke and have some DOE, no leg swelling, chest pain or palpitations.    She is still smoking.      Current Medications (verified): 1)  Protonix 40 Mg  Tbec (Pantoprazole Sodium) .... Take 1 Tablet By Mouth Once A Day 2)  Alprazolam 0.5 Mg  Tabs (Alprazolam) .Marland Kitchen.. 1 Tab By Mouth Two Times A Day As Needed Anxiety 3)  Zoloft 100 Mg Tabs (Sertraline Hcl) .... 1.5 Tabs By Mouth Daily 4)  Cyclobenzaprine Hcl 10 Mg  Tabs (Cyclobenzaprine Hcl) .... Take 1 Tablet By Mouth Once A Day At Bedtime As Needed 5)  Seroquel 50 Mg Tabs (Quetiapine Fumarate) .Marland Kitchen.. 1 Tab By Mouth Qhs 6)  Gabapentin 600 Mg Tabs (Gabapentin) .Marland Kitchen.. 1 Tab By Mouth Tid 7)  Mobic 15 Mg Tabs (Meloxicam) .Marland Kitchen.. 1 Tab By Mouth Daily W/ Food 8)  Promethazine Hcl 25 Mg Tabs (Promethazine Hcl) .Marland Kitchen.. 1 Tab By Mouth Q 6 Hrs As Needed Nausea 9)  Lamictal 150 Mg Tabs (Lamotrigine) .... Take 1 Tab By Mouth Once Daily 10)  Crestor 20 Mg Tabs (Rosuvastatin Calcium) .Marland Kitchen.. 1 Tab By Mouth Qhs 11)  Metoprolol Tartrate 50 Mg Tabs (Metoprolol Tartrate) .Marland Kitchen.. 1 Tab By Mouth Bid 12)  Proair Hfa 108 (90 Base) Mcg/act Aers (Albuterol Sulfate) .... 2 Puffs Q 6 Hrs Prn 13)  Lodrane 24 12 Mg Xr24h-Cap (Brompheniramine Maleate) .Marland Kitchen.. 1-2 Capsules By Mouth Once Daily For Allergies 14)  Trazodone Hcl 50 Mg Tabs (Trazodone Hcl) .... Take 1 Tab By Mouth At Bedtime 15)   Topamax 100 Mg Tabs (Topiramate) .... Take 1 1/2 Tabs By Mouth Once Daily 16)  Tramadol Hcl 50 Mg Tabs (Tramadol Hcl) .... Take 1 Tab By Mouth Every 6 Hours As Needed Arm Pain  Allergies (verified): 1)  ! * Lyrica 2)  ! Benadryl  Past History:  Past Medical History: bipolar d/o-- Dr Christell Constant menopausal syndrome fibromyalgia CAD, 50% occlusion on cath 2006 (Dr Garnette Scheuermann) high chol premature menopause on HRT since 2006 G2P2002 HTN chronic back pain GERD RLS L4-5 disc protrusion with disc dessication and narrowing on MRI 2005; repeat 09-2008  ortho  Dr Tasia Catchings neuro Dr Marcello Moores  Past Surgical History: Reviewed history from 03/19/2007 and no changes required. TAH with oophorectomy for DUB/ovarian cysts  Social History: Reviewed history from 03/19/2007 and no changes required. On disability for depression.  Used to work at [employer]. Finished HS.  Married to [place]. 2 teenage sons. Smokes 1 ppd x 10 yrs. 4 ETOH/ wk. Plays basketball and walks.  Review of Systems      See HPI  Physical Exam  General:  alert, well-developed, well-nourished, and well-hydrated.   Head:  normocephalic and atraumatic.   Eyes:  pupils equal, pupils round, and pupils reactive to light.   Mouth:  pharynx pink and moist.   Neck:  no masses.   Lungs:  Normal respiratory effort, chest expands symmetrically. Lungs are clear to auscultation, no crackles or wheezes.  splinting with deep inspiration Heart:  Normal rate and regular rhythm. S1 and S2 normal without gallop, murmur, click, rub or other extra sounds. Extremities:  no LE edema LUE in cast Skin:  color normal.   Psych:  good eye contact, not anxious appearing, and not depressed appearing.     Impression & Recommendations:  Problem # 1:  ESSENTIAL HYPERTENSION, BENIGN (ICD-401.1) BP remains high.  Will add Lisinopril 10 mg/ day and RTC/ update labs in 2 mos.  Call if any problems.  Edema has improved. Her updated medication list for this problem  includes:    Metoprolol Tartrate 50 Mg Tabs (Metoprolol tartrate) .Marland Kitchen... 1 tab by mouth bid    Lisinopril 10 Mg Tabs (Lisinopril) .Marland Kitchen... 1 tab by mouth daily  BP today: 157/101 Prior BP: 140/92 (03/05/2010)  Labs Reviewed: K+: 4.3 (03/06/2010) Creat: : 0.65 (03/06/2010)   Chol: 267 (05/24/2009)   HDL: 36 (05/24/2009)   LDL: 167 (05/24/2009)   TG: 320 (05/24/2009)  Complete Medication List: 1)  Protonix 40 Mg Tbec (Pantoprazole sodium) .... Take 1 tablet by mouth once a day 2)  Alprazolam 0.5 Mg Tabs (Alprazolam) .Marland Kitchen.. 1 tab by mouth two times a day as needed anxiety 3)  Zoloft 100 Mg Tabs (Sertraline hcl) .... 1.5 tabs by mouth daily 4)  Cyclobenzaprine Hcl 10 Mg Tabs (Cyclobenzaprine hcl) .... Take 1 tablet by mouth once a day at bedtime as needed 5)  Seroquel 50 Mg Tabs (Quetiapine fumarate) .Marland Kitchen.. 1 tab by mouth qhs 6)  Gabapentin 600 Mg Tabs (Gabapentin) .Marland Kitchen.. 1 tab by mouth tid 7)  Mobic 15 Mg Tabs (Meloxicam) .Marland Kitchen.. 1 tab by mouth daily w/ food 8)  Lamictal 150 Mg Tabs (Lamotrigine) .... Take 1 tab by mouth once daily 9)  Crestor 20 Mg Tabs (Rosuvastatin calcium) .Marland Kitchen.. 1 tab by mouth qhs 10)  Metoprolol Tartrate 50 Mg Tabs (Metoprolol tartrate) .Marland Kitchen.. 1 tab by mouth bid 11)  Proair Hfa 108 (90 Base) Mcg/act Aers (Albuterol sulfate) .... 2 puffs q 6 hrs prn 12)  Trazodone Hcl 50 Mg Tabs (Trazodone hcl) .... Take 1 tab by mouth at bedtime 13)  Topamax 100 Mg Tabs (Topiramate) .... Take 1 1/2 tabs by mouth once daily 14)  Tramadol Hcl 50 Mg Tabs (Tramadol hcl) .... Take 1 tab by mouth every 6 hours as needed arm pain 15)  Lisinopril 10 Mg Tabs (Lisinopril) .Marland Kitchen.. 1 tab by mouth daily  Patient Instructions: 1)  Start Lisinopril 10 mg once daily for high BP. 2)  Continue all other meds. 3)  Return for f/u BP with [facility] LAB[facility] in 2 mos. Prescriptions: LISINOPRIL 10 MG TABS (LISINOPRIL) 1 tab by mouth daily  #30 x 2   Entered and Authorized by:   Seymour Bars DO   Signed by:   Seymour Bars DO on  04/06/2010   Method used:   Electronically to        [facility]* (retail)       [address] [place], [place]  [address]       Ph: [phone]  Fax: [phone]   RxID:   [id number]    Orders Added: 1)  Est. Patient Level III [[address]]

## 2010-06-06 ENCOUNTER — Encounter: Payer: Self-pay | Admitting: Family Medicine

## 2010-06-06 ENCOUNTER — Ambulatory Visit (INDEPENDENT_AMBULATORY_CARE_PROVIDER_SITE_OTHER): Payer: Medicare Other | Admitting: Family Medicine

## 2010-06-06 DIAGNOSIS — E785 Hyperlipidemia, unspecified: Secondary | ICD-10-CM

## 2010-06-06 DIAGNOSIS — I10 Essential (primary) hypertension: Secondary | ICD-10-CM

## 2010-06-07 ENCOUNTER — Encounter: Payer: Self-pay | Admitting: Family Medicine

## 2010-06-08 LAB — CONVERTED CEMR LAB
BUN: 4 mg/dL — ABNORMAL LOW (ref 6–23)
CO2: 26 meq/L (ref 19–32)
Calcium: 9.9 mg/dL (ref 8.4–10.5)
Chloride: 103 meq/L (ref 96–112)
Cholesterol: 253 mg/dL — ABNORMAL HIGH (ref 0–200)
Creatinine, Ser: 0.62 mg/dL (ref 0.40–1.20)
Glucose, Bld: 93 mg/dL (ref 70–99)
HDL: 31 mg/dL — ABNORMAL LOW (ref >39)
LDL Cholesterol: 191 mg/dL — ABNORMAL HIGH (ref 0–99)
Potassium: 4.6 meq/L (ref 3.5–5.3)
Sodium: 141 meq/L (ref 135–145)
Total CHOL/HDL Ratio: 8.2
Triglycerides: 157 mg/dL — ABNORMAL HIGH (ref <150)
VLDL: 31 mg/dL (ref 0–40)

## 2010-06-12 NOTE — Assessment & Plan Note (Signed)
Summary: BP/ lab f/u   Vital Signs:  Patient profile:   40 year old female Height:      67 inches Weight:      154 pounds BMI:     24.21 O2 Sat:      98 % on Room air Pulse rate:   104 / minute BP sitting:   132 / 81  (left arm) Cuff size:   regular  Vitals Entered By: Payton Spark CMA (June 06, 2010 10:46 AM)  O2 Flow:  Room air CC: 2 mo f/u    Primary Care Provider:  Seymour Bars DO  CC:  2 mo f/u .  History of Present Illness: 40 yo WF presents for f/u visit.  At her last visit, I started her on Lisinopril 10 mg/ day which has really helped her BP.  She admits to financial problems and has been less compliant with some of her more expensive meds.  Denies adverse SEs and has really been trying to eat healthier.  She is not retaining fluid like she was before.  She is due to have her f/u BMP today and is due for her FLP, started cholesterol meds last year.  Continues to smoke.  Had a cold 2 wks ago and continues to have some coughing spells with SOB at night.  Denies chest tightness, fevers or sputum production.  Current Medications (verified): 1)  Protonix 40 Mg  Tbec (Pantoprazole Sodium) .... Take 1 Tablet By Mouth Once A Day 2)  Alprazolam 0.5 Mg  Tabs (Alprazolam) .Marland Kitchen.. 1 Tab By Mouth Two Times A Day As Needed Anxiety 3)  Zoloft 100 Mg Tabs (Sertraline Hcl) .... 1.5 Tabs By Mouth Daily 4)  Cyclobenzaprine Hcl 10 Mg  Tabs (Cyclobenzaprine Hcl) .... Take 1 Tablet By Mouth Once A Day At Bedtime As Needed 5)  Seroquel 50 Mg Tabs (Quetiapine Fumarate) .Marland Kitchen.. 1 Tab By Mouth Qhs 6)  Gabapentin 600 Mg Tabs (Gabapentin) .Marland Kitchen.. 1 Tab By Mouth Tid 7)  Mobic 15 Mg Tabs (Meloxicam) .Marland Kitchen.. 1 Tab By Mouth Daily W/ Food 8)  Lamictal 150 Mg Tabs (Lamotrigine) .... Take 1 Tab By Mouth Once Daily 9)  Crestor 20 Mg Tabs (Rosuvastatin Calcium) .Marland Kitchen.. 1 Tab By Mouth Qhs 10)  Metoprolol Tartrate 50 Mg Tabs (Metoprolol Tartrate) .Marland Kitchen.. 1 Tab By Mouth Bid 11)  Proair Hfa 108 (90 Base) Mcg/act Aers  (Albuterol Sulfate) .... 2 Puffs Q 6 Hrs Prn 12)  Trazodone Hcl 50 Mg Tabs (Trazodone Hcl) .... Take 1 Tab By Mouth At Bedtime 13)  Topamax 100 Mg Tabs (Topiramate) .... Take 1 1/2 Tabs By Mouth Once Daily 14)  Tramadol Hcl 50 Mg Tabs (Tramadol Hcl) .... Take 1 Tab By Mouth Every 6 Hours As Needed Arm Pain 15)  Lisinopril 10 Mg Tabs (Lisinopril) .Marland Kitchen.. 1 Tab By Mouth Daily  Allergies (verified): 1)  ! * Lyrica 2)  ! Benadryl  Past History:  Past Medical History: Reviewed history from 04/06/2010 and no changes required. bipolar d/o-- Dr Christell Constant menopausal syndrome fibromyalgia CAD, 50% occlusion on cath 2006 (Dr Garnette Scheuermann) high chol premature menopause on HRT since 2006 G2P2002 HTN chronic back pain GERD RLS L4-5 disc protrusion with disc dessication and narrowing on MRI 2005; repeat 09-2008  ortho  Dr Tasia Catchings neuro Dr Marcello Moores  Past Surgical History: Reviewed history from 03/19/2007 and no changes required. TAH with oophorectomy for DUB/ovarian cysts  Social History: Reviewed history from 03/19/2007 and no changes required. On disability for depression.  Used to work at [employer]. Finished HS.  Married to [place]. 2 teenage sons. Smokes 1 ppd x 10 yrs. 4 ETOH/ wk. Plays basketball and walks.  Review of Systems      See HPI  Physical Exam  General:  alert, well-developed, well-nourished, and well-hydrated.   Head:  normocephalic and atraumatic.   Eyes:  pupils equal, pupils round, and pupils reactive to light.   Mouth:  good dentition and pharynx pink and moist.   Neck:  no masses and no thyromegaly.   Lungs:  Normal respiratory effort, chest expands symmetrically. Lungs are clear to auscultation, no crackles or wheezes. Heart:  Normal rate and regular rhythm. S1 and S2 normal without gallop, murmur, click, rub or other extra sounds. Pulses:  2+ radial pulses Extremities:  no Le edema Skin:  color normal.   Psych:  good eye contact, not anxious appearing, and not depressed  appearing.     Impression & Recommendations:  Problem # 1:  ESSENTIAL HYPERTENSION, BENIGN (ICD-401.1) Assessment Improved Update BMP for new start ACEi and continue current meds. Her updated medication list for this problem includes:    Metoprolol Tartrate 50 Mg Tabs (Metoprolol tartrate) .Marland Kitchen... 1 tab by mouth bid    Lisinopril 10 Mg Tabs (Lisinopril) .Marland Kitchen... 1 tab by mouth daily  Orders: T-Basic Metabolic Panel ([id number])  BP today: 132/81 Prior BP: 157/101 (04/06/2010)  Labs Reviewed: K+: 4.3 (03/06/2010) Creat: : 0.65 (03/06/2010)   Chol: 267 (05/24/2009)   HDL: 36 (05/24/2009)   LDL: 167 (05/24/2009)   TG: 320 (05/24/2009)  Problem # 2:  HYPERLIPIDEMIA (ICD-272.4) Due to recheck labs today.  Doin gwell on Crestor. Her updated medication list for this problem includes:    Crestor 20 Mg Tabs (Rosuvastatin calcium) .Marland Kitchen... 1 tab by mouth qhs  Orders: T-Lipid Profile [id number])  Labs Reviewed: SGOT: 13 (03/06/2010)   SGPT: 13 (03/06/2010)   HDL:36 (05/24/2009)  LDL:167 (05/24/2009)  Chol:267 (05/24/2009)  Trig:320 (05/24/2009)  Complete Medication List: 1)  Protonix 40 Mg Tbec (Pantoprazole sodium) .... Take 1 tablet by mouth once a day 2)  Alprazolam 0.5 Mg Tabs (Alprazolam) .Marland Kitchen.. 1 tab by mouth two times a day as needed anxiety 3)  Zoloft 100 Mg Tabs (Sertraline hcl) .... 1.5 tabs by mouth daily 4)  Cyclobenzaprine Hcl 10 Mg Tabs (Cyclobenzaprine hcl) .... Take 1 tablet by mouth once a day at bedtime as needed 5)  Seroquel 50 Mg Tabs (Quetiapine fumarate) .Marland Kitchen.. 1 tab by mouth qhs 6)  Gabapentin 600 Mg Tabs (Gabapentin) .Marland Kitchen.. 1 tab by mouth tid 7)  Mobic 15 Mg Tabs (Meloxicam) .Marland Kitchen.. 1 tab by mouth daily w/ food 8)  Lamictal 150 Mg Tabs (Lamotrigine) .... Take 1 tab by mouth once daily 9)  Crestor 20 Mg Tabs (Rosuvastatin calcium) .Marland Kitchen.. 1 tab by mouth qhs 10)  Metoprolol Tartrate 50 Mg Tabs (Metoprolol tartrate) .Marland Kitchen.. 1 tab by mouth bid 11)  Proair Hfa 108 (90 Base) Mcg/act  Aers (Albuterol sulfate) .... 2 puffs q 6 hrs prn 12)  Trazodone Hcl 50 Mg Tabs (Trazodone hcl) .... Take 1 tab by mouth at bedtime 13)  Topamax 100 Mg Tabs (Topiramate) .... Take 1 1/2 tabs by mouth once daily 14)  Tramadol Hcl 50 Mg Tabs (Tramadol hcl) .... Take 1 tab by mouth every 6 hours as needed arm pain 15)  Lisinopril 10 Mg Tabs (Lisinopril) .Marland Kitchen.. 1 tab by mouth daily  Patient Instructions: 1)  Labs today. 2)  Will call you w/ results tomorrow.  3)  Look into MED - AID for help paying for your meds. 4)  Use ProAir 2 puffs at bedtime for residual wheezing/ cough. 5)  Avoid smoking. 6)  Call if cough has not resolved in 10 days. 7)  REturn for f/u in 3 mos.   Orders Added: 1)  T-Basic Metabolic Panel [[id number]] 2)  T-Lipid Profile [[id number]] 3)  Est. Patient Level III [16109]

## 2010-07-04 ENCOUNTER — Encounter (INDEPENDENT_AMBULATORY_CARE_PROVIDER_SITE_OTHER): Payer: Medicare Other | Admitting: Licensed Clinical Social Worker

## 2010-07-04 DIAGNOSIS — F331 Major depressive disorder, recurrent, moderate: Secondary | ICD-10-CM

## 2010-07-08 LAB — CBC
HCT: 39.2 % (ref 36.0–46.0)
Hemoglobin: 13.4 g/dL (ref 12.0–15.0)
MCHC: 34.1 g/dL (ref 30.0–36.0)
MCHC: 34.9 g/dL (ref 30.0–36.0)
MCV: 95.1 fL (ref 78.0–100.0)
MCV: 95.5 fL (ref 78.0–100.0)
Platelets: 239 10*3/uL (ref 150–400)
Platelets: 277 10*3/uL (ref 150–400)
RBC: 4.11 MIL/uL (ref 3.87–5.11)
RDW: 13.1 % (ref 11.5–15.5)
RDW: 13.1 % (ref 11.5–15.5)
WBC: 12.6 10*3/uL — ABNORMAL HIGH (ref 4.0–10.5)

## 2010-07-08 LAB — URINE DRUGS OF ABUSE SCREEN W ALC, ROUTINE (REF LAB)
Cocaine Metabolites: NEGATIVE
Creatinine,U: 20.9 mg/dL
Marijuana Metabolite: POSITIVE — AB
Methadone: NEGATIVE
Opiate Screen, Urine: NEGATIVE
Phencyclidine (PCP): NEGATIVE
Propoxyphene: NEGATIVE

## 2010-07-08 LAB — DIFFERENTIAL
Basophils Absolute: 0.1 10*3/uL (ref 0.0–0.1)
Basophils Relative: 1 % (ref 0–1)
Eosinophils Absolute: 0.2 10*3/uL (ref 0.0–0.7)
Eosinophils Relative: 2 % (ref 0–5)
Lymphocytes Relative: 31 % (ref 12–46)
Lymphs Abs: 3.9 10*3/uL (ref 0.7–4.0)
Monocytes Absolute: 0.8 10*3/uL (ref 0.1–1.0)
Monocytes Relative: 6 % (ref 3–12)
Neutro Abs: 7.7 10*3/uL (ref 1.7–7.7)
Neutrophils Relative %: 61 % (ref 43–77)

## 2010-07-08 LAB — BASIC METABOLIC PANEL
BUN: 2 mg/dL — ABNORMAL LOW (ref 6–23)
CO2: 28 mEq/L (ref 19–32)
Calcium: 9.5 mg/dL (ref 8.4–10.5)
Chloride: 101 mEq/L (ref 96–112)
Creatinine, Ser: 0.55 mg/dL (ref 0.4–1.2)
GFR calc Af Amer: >60 mL/min (ref >60)
GFR calc non Af Amer: >60 mL/min (ref >60)
Glucose, Bld: 90 mg/dL (ref 70–99)
Potassium: 3.2 mEq/L — ABNORMAL LOW (ref 3.5–5.1)
Sodium: 138 mEq/L (ref 135–145)

## 2010-07-08 LAB — LIPID PANEL
Cholesterol: 280 mg/dL — ABNORMAL HIGH (ref 0–200)
Total CHOL/HDL Ratio: 8.5 RATIO

## 2010-07-08 LAB — CARDIAC PANEL(CRET KIN+CKTOT+MB+TROPI)
CK, MB: 0.5 ng/mL (ref 0.3–4.0)
CK, MB: 0.5 ng/mL (ref 0.3–4.0)
Relative Index: INVALID (ref 0.0–2.5)
Relative Index: INVALID (ref 0.0–2.5)
Total CK: 36 U/L (ref 7–177)
Troponin I: 0.01 ng/mL (ref 0.00–0.06)
Troponin I: 0.03 ng/mL (ref 0.00–0.06)
Troponin I: <0.01 ng/mL (ref 0.00–0.06)

## 2010-07-08 LAB — POCT CARDIAC MARKERS
CKMB, poc: <1 ng/mL — ABNORMAL LOW (ref 1.0–8.0)
CKMB, poc: <1 ng/mL — ABNORMAL LOW (ref 1.0–8.0)
Myoglobin, poc: 52.1 ng/mL (ref 12–200)
Myoglobin, poc: 57.7 ng/mL (ref 12–200)
Troponin i, poc: <0.05 ng/mL (ref 0.00–0.09)

## 2010-07-08 LAB — HEPATIC FUNCTION PANEL: Total Protein: 6.4 g/dL (ref 6.0–8.3)

## 2010-07-08 LAB — D-DIMER, QUANTITATIVE: D-Dimer, Quant: <0.22 ug/mL-FEU (ref 0.00–0.48)

## 2010-07-08 LAB — APTT: aPTT: 32 seconds (ref 24–37)

## 2010-07-08 LAB — PROTIME-INR
INR: 1.1 (ref 0.00–1.49)
Prothrombin Time: 14.3 seconds (ref 11.6–15.2)

## 2010-07-09 ENCOUNTER — Encounter (HOSPITAL_COMMUNITY): Payer: Self-pay | Admitting: Psychiatry

## 2010-07-10 ENCOUNTER — Encounter (INDEPENDENT_AMBULATORY_CARE_PROVIDER_SITE_OTHER): Payer: Medicare Other | Admitting: Psychiatry

## 2010-07-10 DIAGNOSIS — F411 Generalized anxiety disorder: Secondary | ICD-10-CM

## 2010-07-10 DIAGNOSIS — F339 Major depressive disorder, recurrent, unspecified: Secondary | ICD-10-CM

## 2010-07-11 ENCOUNTER — Encounter (INDEPENDENT_AMBULATORY_CARE_PROVIDER_SITE_OTHER): Payer: Medicare Other | Admitting: Licensed Clinical Social Worker

## 2010-07-11 DIAGNOSIS — F331 Major depressive disorder, recurrent, moderate: Secondary | ICD-10-CM

## 2010-07-19 ENCOUNTER — Encounter (HOSPITAL_COMMUNITY): Payer: Medicare Other | Admitting: Licensed Clinical Social Worker

## 2010-07-26 ENCOUNTER — Other Ambulatory Visit: Payer: Self-pay | Admitting: *Deleted

## 2010-07-26 MED ORDER — GABAPENTIN 300 MG PO CAPS: 300.0000 mg | ORAL_CAPSULE | Freq: Three times a day (TID) | ORAL | Status: DC

## 2010-07-26 MED ORDER — AMITRIPTYLINE HCL 50 MG PO TABS: 50.0000 mg | ORAL_TABLET | Freq: Every day | ORAL | Status: DC

## 2010-08-06 ENCOUNTER — Other Ambulatory Visit: Payer: Self-pay | Admitting: Family Medicine

## 2010-08-14 NOTE — Discharge Summary (Signed)
[name], [name]            ACCOUNT NO.:  [id number]   MEDICAL RECORD NO.:  [id number]          PATIENT TYPE:  OBV   LOCATION:  4704                         FACILITY:  [facility]   PHYSICIAN:  Lonia Blood, M.D.       DATE OF BIRTH:  02-20-1971   DATE OF ADMISSION:  10/12/2008  DATE OF DISCHARGE:  10/14/2008                               DISCHARGE SUMMARY   PRIMARY CARE PHYSICIAN:  Seymour Bars, DO, with [facility] [place].   DISCHARGE DIAGNOSIS:  1. Chest pain - ruled out for myocardial infarction, will follow up      with Dr. Katrinka Blazing from [facility].  2. Herniated L5-S1 disk, stable compared to MRI from last year - the      patient will follow up in Neurosurgery as previously scheduled.  3. Fibromyalgia.  4. Bipolar disorder.  5. Generalized anxiety disorder.  6. Gastroesophageal reflux disease.  7. Status post cholecystectomy and hysterectomy.  8. Tobacco abuse.   DISCHARGE MEDICATIONS:  1. Xanax 0.5 mg 3 times a day.  2. Zoloft 190 mg daily.  3. Ambien 5 mg at bedtime.  4. Flexeril 10 mg at bedtime.  5. Seroquel 50 mg at bedtime.  6. Nitroglycerin as needed for chest pain.  7. Percocet as needed for pain.  8. Neurontin 670 mg 3 times a day.  9. Senokot 2 tablets at bedtime.  10.Aspirin 81 mg daily.  11.Nicotine patch 21 mg daily.   CONDITION ON DISCHARGE:  [name] is discharged in good condition.  She is instructed to increase her level of activity as the pain will  allow.  She will follow up with Home Health Physical Therapy and with  Neurosurgery as previously scheduled.   PROCEDURE ON THIS ADMISSION:  No procedures done.   CONSULTATIONS ON ADMISSION:  The patient was seen in consultation by Dr.  Katrinka Blazing from [facility].   HISTORY AND PHYSICAL:  Refer to dictated H and P which was done by Dr.  Alfredia Client.   HOSPITAL COURSE:  1. [name] is a 40 year old woman with known nonobstructive      coronary artery disease, presented to emergency room  with      complaints of chest pain.  She ruled out for myocardial infarction,      was seen in consultation by Dr. Katrinka Blazing from Cardiology who suggested      the patient to follow up in the office for outpatient stress      testing.  [name] did not have any recurrence of her angina in      the hospital, and she remained without any arrhythmia on the      monitor.  She will follow up with [facility] as outpatient.  2. Herniated lumbar spine disk.  [name] was seen by the physical      therapist and was started on an exercise program together with pain      relief with Percocet.  The patient will follow up with the      Neurosurgery as previously scheduled.  3. Tobacco abuse.  [name] was counseled and started on  nicotine      patches.  4. Fibromyalgia and bipolar disorder.  We have continued [name]'s      medications without changes during this admission.      Lonia Blood, M.D.  Electronically Signed     SL/MEDQ  D:  10/14/2008  T:  10/15/2008  Job:  [id number]   cc:   Seymour Bars, D.O.

## 2010-08-14 NOTE — H&P (Signed)
[name], [name]            ACCOUNT NO.:  [id number]   MEDICAL RECORD NO.:  [id number]          PATIENT TYPE:  INP   LOCATION:  4704                         FACILITY:  [facility]   PHYSICIAN:  Lucile Crater, MD         DATE OF BIRTH:  [birth year]   DATE OF ADMISSION:  10/12/2008  DATE OF DISCHARGE:                              HISTORY & PHYSICAL   PRIMARY CARE PHYSICIAN:  Seymour Bars, D.O., at [facility].   CHIEF COMPLAINT:  Chest pain.   HISTORY OF PRESENT ILLNESS:  Ms. Hopfer is a 40 year old Caucasian  female who comes in with chest pain that started last night.  It was  sudden in onset.  It was located substernally, 8/10 in intensity, with  radiation to the left shoulder at times.  The pain lasted a few minutes  until she took sublingual nitroglycerin, which completely relieved the  pain.  The chest pain was associated with diaphoresis, lightheadedness,  shortness of breath and palpitations.  She denies having any cough or  fever or chills.  The pain is not pleuritic, it is not positional, and  it is not reproducible with palpation.  She had similar episodes in the  past, at which time a stress test was done but despite the stress test  being negative, given her risk factors of smoking, family history and  typical symptoms, she underwent a left heart catheterization and it  showed a 30-50% stenosis of the left anterior descending.  No  intervention was done at that time because this was like a  nonobstructing lesion.  She is back again with similar chest pain.  She  has an appointment with her cardiologist in August.  She was recommended  to be on aspirin and Plavix when she was discharged after the left heart  catheterization in 2005 but the patient has not been taking aspirin or  Plavix.  The patient also has a history of panic attacks but she states  that the pain today is not similar to any of her panic attacks.  She has  been also stressed lately  about her son and so she has been smoking  again.  She is currently smoking a pack of cigarettes a day.   REVIEW OF SYSTEMS:  A complete review of systems was done, which  includes general, head, eyes, ears, nose, throat, cardiovascular,  respiratory, GI, GU, endocrine, musculoskeletal, skin, neurologic and  psychiatric.  All are within normal limits other than what is mentioned  in the history of present illness.  Also, she has some lower back pain  and neck pain.   PAST MEDICAL HISTORY:  1. Nonobstructive coronary artery disease on left heart      catheterization in October 2005.  2. Elevated CRP level.  3. Fibromyalgia.  4. Bipolar disorder.  5. Generalized anxiety disorder.  6. Panic attacks.  7. Gastroesophageal reflux disease.  8. Cholecystectomy.  9. Hysterectomy.  10.Tobacco abuse.   ALLERGIES:  None.   SOCIAL HISTORY:  There is history of tobacco abuse which is ongoing.  She currently smokes a pack of  cigarettes per day.  There is occasional  consumption of alcohol.  There is remote history of marijuana abuse.  She denies the use of any IV drugs.   FAMILY HISTORY:  Positive for coronary artery disease in her father, who  had an MI in his 60s.  There is also a history of peripheral vascular  disease in the family.   PHYSICAL EXAMINATION:  VITAL SIGNS:  T-max 98.4, blood pressure 135/77,  pulse rate 71, respirations 22.  GENERAL APPEARANCE:  Not in any acute distress.  Alert, awake, oriented  x3.  Afebrile.  HEENT:  Normocephalic, atraumatic.  The pupils are equal and react to  light and accommodation.  Extraocular muscles are intact.  The mucous  membranes are moist.  NECK:  Supple.  No JVD, lymphadenopathy or carotid bruit.  CVS:  Regular rhythm.  Rate is normal.  No murmurs, rubs or gallops.  LUNGS:  Clear to auscultation bilaterally.  EXTREMITIES:  No clubbing, cyanosis, or edema.  ABDOMEN:  Benign.  NEUROLOGIC EXAM:  Grossly nonfocal.   LABS AND STUDIES:   CBC with differential:  WBC 12,600, hemoglobin 13.4,  hematocrit 39, platelets 277,000.  Normal differential.  D-dimer less  than 0.22.  Sodium 138, potassium 3.2, chloride 101, bicarb 28, BUN 2,  creatinine 0.55, blood glucose 90.  Two sets of cardiac enzymes are  negative.  EKG reveals normal sinus rhythm with a rate of 72 beats per  minute.  The axis is normal.  PR and QT intervals are within normal  limits.  Nonspecific ST-T changes.   ASSESSMENT AND PLAN:  1. Chest pain.  The patient does have a history of nonobstructive      coronary artery disease.  She had a lesion of 30-50% in the left      anterior descending artery.  She does have risk factors like family      history and ongoing tobacco abuse.  These symptoms are typical as      well.  We would evaluate for cardiac ischemia.  We will consult      with cardiology regarding if she needs a stress test or a      catheterization.  We will start the patient on ACS protocol.  We      will also do a urine drug screen.  We will cycle cardiac enzymes.      We will get an EKG in the morning.  We will monitor her closely on      telemetry.  2. Major depressive disorder.  She does not have any suicidal      ideations.  We will continue the SSRI.  3. Generalized anxiety disorder.  We will continue benzodiazepine as      needed.  4. Tobacco abuse.  Extensive counseling was done today.  This would be      the most important thing for her to do to decrease her      cardiovascular risk.  The patient verbalized understanding of this.  5. Chronic pain issues.  Continue Neurontin.  6. DVT prophylaxis with Lovenox.  7. Fluids, electrolytes, and nutrition.  We will replace electrolytes      as needed.  She will be started on normal saline IV.  The patient      will be started on an AHA diet.   DISPOSITION:  The patient will be admitted to a medical floor with  telemetry.  We will consult cardiology regarding further management.     Tej  Alfredia Client, MD  Electronically Signed    TA/MEDQ  D:  10/13/2008  T:  10/13/2008  Job:  [id number]   cc:   Seymour Bars, D.O.

## 2010-08-17 NOTE — H&P (Signed)
NAMELIANI, Anne NO.:  Scott   MEDICAL RECORD NO.:  Scott                   PATIENT TYPE:  EMS   LOCATION:  MAJO                                 FACILITY:  [facility]   PHYSICIAN:  Corinna L. Lendell Caprice, MD             DATE OF BIRTH:  10-09-1970   DATE OF ADMISSION:  12/13/2003  DATE OF DISCHARGE:                                HISTORY & PHYSICAL   CHIEF COMPLAINT:  Chest pain.   HISTORY OF PRESENT ILLNESS:  [name] is a 40 year old white female who  presents to the emergency room with a two-day history of waxing and waning  sharp chest pain that is located in both the right and left chest.  She has  not felt this sensation before.  She has been given morphine prior to the  history and physical and is somewhat difficult to get history from.  She  reports that she has had subjective chills, no fevers.  She has not had a  cough, but she reports her husband notices a cough at night.  She has had  some vague weight loss.   PAST MEDICAL HISTORY:  1.  Fibromyalgia.  2.  Bipolar disorder.  3.  Gastroesophageal reflux disease.  4.  Recent laparoscopic cholecystectomy at [facility].   MEDICATIONS:  Topamax, Protonix, Flexeril, Klonopin, aspirin.   SOCIAL HISTORY:  The patient smokes a pack of cigarettes a day.  She denies  drinking or drugs.   FAMILY HISTORY:  Her mother has had two strokes.  Her father has coronary  artery disease and is in his mid-50s.   REVIEW OF SYSTEMS:  As above, otherwise negative.   PHYSICAL EXAMINATION:  VITAL SIGNS:  Temperature is 97.9, blood pressure  110/66, pulse 91, respiratory rate 22, oxygen saturation 96% on room air.  GENERAL:  The patient is very groggy and somnolent, falling asleep often in  the middle of our history and physical.  She appears to be in no pain.  HEENT:  Normocephalic, atraumatic.  Pupils equal, round, and reactive to  light.  Moist mucous membranes.  Oropharynx is without  erythema or exudate.  NECK:  Supple, no lymphadenopathy, no thyromegaly.  CHEST:  Lungs are with occasional rales at both bases, no wheezes or  rhonchi.  No chest wall tenderness.  CARDIOVASCULAR:  Regular rate and rhythm without murmurs, gallops, rubs.  ABDOMEN:  Soft, nontender, normal bowel sounds, no mass.  EXTREMITIES:  No clubbing, cyanosis, or edema.  Pulses are intact.  LYMPHATIC:  No lymphadenopathy.  NEUROLOGIC:  The patient is somnolent but easily arousable.  No cranial  nerve deficits.  Motor strength and sensorimotor exam are intact.  SKIN:  No rash, no purpura.  PSYCHIATRIC:  Normal affect.   LABORATORY DATA:  White blood cell count 10.4, hemoglobin 11.5, hematocrit  32.7, platelet count 246, 7% eosinophils.  D-dimer normal.  Basic metabolic  panel  essentially normal.  CPK, MB, and troponin are normal.  EKG shows  normal sinus rhythm.  Chest x-ray shows nodular opacity in the right upper  lobe and left base.  CT of the chest shows areas of nodular opacities and  ground-glass appearance in the right upper lobe and both bases.   ASSESSMENT AND PLAN:  1.  Chest pain with abnormal chest x-ray and CT scan.  Consider bacterial      pneumonia versus fungal pneumonia.  Consider septic emboli, Wegener's      granulomatosis, bronchiolitis obliterans with organizing pneumonia.  I      will treat the patient for community-acquired pneumonia with Avelox,      check an ANA, erythrocyte sedimentation rate, HIV, urine drug screen, P-      ANCA and C-ANCA.  She reports she also had a chest x-ray done at      [facility], which I will ask for the report of, and consider      bronchoscopy.  2.  Bipolar disorder.  3.  Fibromyalgia.  4.  Gastroesophageal reflux disease.  5.  Mild eosinophilia, suspect related to above.                                                Corinna L. Lendell Caprice, MD    CLS/MEDQ  D:  12/13/2003  T:  12/13/2003  Job:  [id number]

## 2010-08-17 NOTE — Consult Note (Signed)
[name], NOGALES NO.:  [id number]   MEDICAL RECORD NO.:  [id number]                   PATIENT TYPE:  INP   LOCATION:  2024                                 FACILITY:  [facility]   PHYSICIAN:  Lesleigh Noe, M.D.            DATE OF BIRTH:  09/08/1970   DATE OF CONSULTATION:  12/15/2003  DATE OF DISCHARGE:                                   CONSULTATION   CONCLUSIONS:  1.  Exertional left arm and chest discomfort, burning.      1.  Responsive to sublingual nitroglycerin.  We should rule out coronary          artery disease in this patient with a prior history of substance          abuse.  2.  Nodular densities on chest x-ray of uncertain significance.  3.  Recent cholecystectomy.  4.  History of cigarette smoking.   RECOMMENDATIONS:  1.  Homocysteine level and high-sensitivity CRP.  2.  Stress Cardiolite.  3.  May need cardiac catheterization.  4.  Aspirin therapy.  5.  Continued p.r.n. sublingual nitroglycerin.  6.  Fasting lipid panel.  7.  Discontinue smoking.   COMMENTS:  The patient is 40 years of age and primarily came to the  emergency room on December 13, 2003, after having initially mild exertional  left chest burning with radiation into the inner aspect of the left arm and  neck that progressed to two severe episodes of chest discomfort with  exertion that was relatively modest.  Both episodes of severe discomfort  were relieved by sublingual nitroglycerin after five to 10 minutes.  She has  been relatively pain-free since admission to the hospital and I believe,  coincidentally, was found to have nodular densities on chest x-ray.  She  denies chills or fever.  She has no prior history of heart disease.   FAMILY HISTORY:  Positive for coronary disease.  Father has had two  myocardial infarctions, his first in his 64s.  Her grandfather had his first  myocardial infarction in his late 2s (paternal).   HABITS:  She smokes  cigarettes.  She has previously used cocaine by nasal  sniffing.  Last, she says, approximately eight months ago.  She denies  ethanol consumption.  She does occasionally use marijuana.   SIGNIFICANT PAST MEDICAL PROBLEMS:  1.  Fibromyalgia.  2.  Gastroesophageal reflux.  3.  Bipolar disorder.  4.  Recent cholecystectomy.   MEDICATIONS ON ADMISSION:  1.  Topamax.  2.  Protonix.  3.  Flexeril.  4.  Klonopin.  5.  Aspirin.   PHYSICAL EXAMINATION:  GENERAL:  On exam, the patient is in no acute  distress.  VITAL SIGNS:  Her blood pressure is 105/55, heart rate is 70.  NECK:  Neck veins are not distended, no carotid bruits.  Thyroid is not  palpable.  CHEST:  Lungs  are clear to auscultation and percussion.  CARDIAC:  Normal, no murmur, rub, click, or gallop is heard.  ABDOMEN:  Soft.  EXTREMITIES:  No edema.   EKG is normal x2.  Urine drug screen is positive for metabolites of  marijuana and opiates.  The sedimentation rate is 18.  Renal function is  normal with creatinine of 0.8.  Cardiac markers were negative x3.  Mild  elevation in AST and ALT of 185 and 101, respectively.  Hemoglobin is 11.5,  white count 10.4.  CT scan was reviewed.  Chest x-ray was reviewed.   DISCUSSION:  The patient's history is consistent with exertional angina.  Mitigating against this are her relatively young age.  In favor of it,  however, is her family history, cigarette smoking, and use of opiates.  We  need to do basic labs, including high-sensitivity CRP, homocysteine level,  and lipid panel.  Stress testing should be done and possibly coronary  angiography.                                               Lesleigh Noe, M.D.    HWS/MEDQ  D:  12/15/2003  T:  12/15/2003  Job:  [id number]   cc:   Carmell Austria, M.D.  [facility]

## 2010-08-17 NOTE — Consult Note (Signed)
NAMEMADALYN, [name] NO.:  [id number]   MEDICAL RECORD NO.:  [id number]                   PATIENT TYPE:  INP   LOCATION:  2024                                 FACILITY:  [facility]   PHYSICIAN:  Oley Balm. Sung Amabile, M.D. [facility]          DATE OF BIRTH:  [birth year]   DATE OF CONSULTATION:  12/14/2003  DATE OF DISCHARGE:                                   CONSULTATION   REASON FOR CONSULTATION:  Nodular pulmonary densities.   HISTORY OF PRESENT ILLNESS:  [name] is a 40 year old woman with no  prior pulmonary history who presented on December 13, 2003 with two days of  episodic chest discomfort which she described as burning, substernal,  radiating to left shoulder and left scapula.  The chest discomfort appeared  to be exacerbated by exertion.  She took one of her father's nitroglycerin  and this seemed to relieve it.  She never had similar symptoms in the past.  She denies fevers, chills, and sweats.  She has had no cough or sputum  production.  No significant hemoptysis.  It is notable that she was recently  hospitalized in [facility] for a cholecystectomy.  That was a seven-day  hospitalization.  In the emergency department she was noted to have ill-  defined nodular densities in the right upper lobe and linear densities in  the left base.  A CT scan of the chest was performed confirming the presence  of the above and the results of this are discussed below.   PAST MEDICAL HISTORY:  1.  Fibromyalgia syndrome.  2.  Bipolar disorder.  3.  Panic attacks.  4.  Gastroesophageal reflux disease.  5.  Status post cholecystectomy.  6.  Status post hysterectomy.   SOCIAL HISTORY:  She smokes a pack of cigarettes per day and has done so for  approximately seven years.  She denies excessive alcohol use.  She does  admit to occasional marijuana use, last two weeks ago.  She takes no other  illicit drugs.   FAMILY HISTORY:  Positive for peripheral vascular  disease and coronary  artery disease.   REVIEW OF SYSTEMS:  This is as per the history of present illness and  otherwise a detailed review of systems is negative.  Notably, she has no  known tuberculosis exposures.  She has never been tested for tuberculosis.   PHYSICAL EXAMINATION:  VITAL SIGNS:  Afebrile.  Normal vital signs.  Oxygen  saturation is documented at 94% on room air, though she is presently wearing  nasal cannula oxygen.  GENERAL:  She appears older than her stated age, in no acute cardiac or  respiratory distress.  HEENT:  No acute abnormalities.  NECK:  Supple without adenopathy or jugular venous distention.  CHEST:  Normal percussion note throughout.  Breath sounds are diminished in  the bases with a few basilar crackles.  No wheezes or findings of  consolidation are noted.  CARDIAC:  Regular rate and rhythm with no murmurs.  ABDOMEN:  Soft, nontender with normal bowel sounds.  EXTREMITIES:  Without clubbing, cyanosis, edema.  NEUROLOGIC:  Without focal deficits.   DATA:  Chest x-ray reveals ill-defined nodular densities in the right upper  lobe.  CT scan of the chest confirms the presence of these ill-defined  nodules, some of which have a ground glass appearance.  She also has  bilateral basilar interstitial prominence suggestive of fibrosis versus  atelectasis.   LABORATORY DATA:  Notable for a normal erythrocyte sedimentation rate at 18  mm/hour.  ANA is positive.  Cardiac panel is negative on three occasions.  D-  dimer is normal.  CBC is normal with a white blood cell count of 10,400.  Chemistries are unremarkable.   IMPRESSION:  1.  Symptom complex of acute episodic chest pain as described above of      unclear etiology.  2.  Ill-defined nodular densities predominantly in the right upper lobe with      bibasilar fibrotic changes of unclear etiology.  I am not convinced that      the findings on the CAT scan count for her presentation with chest pain.       The lung parenchyma has no pain receptors and therefore the radiographic      findings should not, in my mind, be associated with pain unless there is      a significant inflammatory component adjacent to the pleura.  The      differential diagnosis for this is well described in the x-ray report.      The radiologist suggests a differential that includes bronchiolitis      obliterans, connective tissue diseases, septic embolization (unlikely),      hematogenous dissemination of metastatic disease (unlikely).  Also,      atypical infection such as tuberculosis or fungal pneumonia is noted.  I      would throw in there the possibility of a respiratory bronchiolitis and      eosinophilic granulomatosis.  However, none of this differential      diagnosis seems to correlate very well with her presenting symptoms.  It      is notable that a chest x-ray report during her recent hospitalization      in [place] suggests that her chest x-ray was entirely normal at      that time.  However, I would also comment that the nodular densities      described above are not easily visualized on the plain films and could      have been missed.  Therefore, their chronicity is unclear.   PLAN:  1.  I concur with current choice of empiric antibiotics with Avelox.  2.  I concur with the current evaluation for connective tissue disorders to      include sedimentation rate, antineutrophilic cytoplasmic antibody,      antinuclear antibody.  HIV status was checked and is negative.  3.  I would like to obtain the chest x-ray from [facility] for my      review.  I would not be surprised if, in retrospect, we see the nodular      densities on that prior film.  If this were the case, I would be less      compelled to pursue a bronchoscopy in the near future.  4.  Place PPD to rule out tuberculosis.  5.  Repeat chest x-ray on December 15, 2003 to follow these radiographic     abnormalities.  With all the  data above, I will decide on the proper      timing of bronchoscopy.                                               Oley Balm Sung Amabile, M.D. [facility]    DBS/MEDQ  D:  12/14/2003  T:  12/14/2003  Job:  [id number]

## 2010-08-17 NOTE — Discharge Summary (Signed)
[name], [name] NO.:  [id number]   MEDICAL RECORD NO.:  [id number]                   PATIENT TYPE:  INP   LOCATION:  2024                                 FACILITY:  [facility]   PHYSICIAN:  Jackie Plum, M.D.             DATE OF BIRTH:  03-14-1971   DATE OF ADMISSION:  12/13/2003  DATE OF DISCHARGE:  12/16/2003                                 DISCHARGE SUMMARY   DIAGNOSES:  1.  Chest pain, resolved.      1.  Stress Cardiolite done on December 15, 2003 was negative for any          ischemia or myocardial infarction.  Ejection fraction was normal.      2.  Homocysteine 9.2, high sensitive CRT 6.2 (high risk), lipid panel          pending.      3.  Serial cardiac enzymes negative for any myocardial infarction.  2.  Abnormal chest x-ray and CT scan.  Etiology unclear.  Could be atypical      pneumonia/pneumonitis.      1.  CT scan of the chest remarkable for nodular air space disease with          ground glass appearance positive in the right upper lobe and coarse          interstitial opacity at the bases.      2.  PPD was planted and reading still pending, to be read at outpatient          level tomorrow or the day after tomorrow after 48-72 hours.      3.  ESR 18, antinuclear antibody positive at 1:160 with speckled          pattern.  Neutrophil cytoplasmic antibody/IgG titer less than 1:16.          Mildly positive antibodies 1.3,  __________  3 1.0.  Transaminases          __________ . Followup recommended.  3.  This is normocytic anemia, mild.  No evidence of ongoing blood loss.      Asymptomatic.  Outpatient followup recommended.  4.  Drug abuse.  5.  History of smoking.   ACTIVITY:  As tolerated.   DISCHARGE MEDICATIONS:  Patient is to resume her pre admission medications.  Medicines in hospital include:  1.  Aspirin 325 mg p.o. daily.  2.  Avelox 400 mg p.o. daily.  3.  Zocor 40 mg daily.  4.  Sublingual nitroglycerin 0.4 p.r.n.  5.  Nicotine patch 14 mg via 24-hour patch daily.   DIET:  Cardiac diet.  Patient is urged to stop smoking cigarettes or using  marijuana.   FOLLOWUP:  She is to report to doctor's office if there are any problems.  Follow-up will be with Dr. Sung Amabile, [facility], on January 02, 2004 at  10:40 a.m.  She is to follow  up with her PCP in about a week or two weeks'  time.  She is to call for appointment.   CONSULTS:  1.  Dr. Sung Amabile of [facility]  2.  Dr. Katrinka Blazing of cardiology   PROCEDURE:  Stress Cardiolite done on December 15, 2003 as noted above.   REASON FOR HOSPITALIZATION:  Chest pain.  Patient presented with exertional  left chest discomfort which was said to be burning and relieved with  sublingual nitroglycerin.  She was noted to have incidental abnormality on  chest x-ray and chest CT (please see above).  She was therefore admitted to  hospital for further evaluation of these two problems.   HOSPITAL COURSE:  She was admitted to telemetry bed.  Serial cardiac enzymes  were obtained which were negative for any myocardial infarction.  Telemetry  monitoring reviewed.  No acute dysrhythmias.  She was started on pain relief  with narcotic medications.  Her urine drug screen was positive for opiates  and marijuana.  Patient was seen in consultation by Dr. Katrinka Blazing of cardiology  who recommended stress Cardiolite which was negative.  He also ordered  fasting lipids which was not available by time of discharge.  The plan is to  discharge patient home on aspirin and p.r.n. sublingual nitroglycerin.  Patient had indicated to me that she does not wish to have any cardiac  catheterization at this point, and I explained to her that she should report  to her doctor or come back to the ED should she experience any further chest  pain at which point cardiac catheterization may be done as needed.  Patient  was seen by Dr. Sung Amabile of pulmonary medicine for evaluation of her  pulmonary  abnormalities.  He recommended PPD which needs to be read tomorrow  or the day after tomorrow after 48-72 hours.  This has been planted on the  patient.  He recommended continued antibiotic with Avelox treatment.  __________ tissue workup was __________  for the patient and __________  discretion at time of discharge.  At this point, __________  for abnormal  pulmonary imaging testing is unclear and she is to follow up with Dr.  Sung Amabile as noted above at which point a repeat x-ray or CT may be done at  that time.  On rounds this morning patient feels better.  She does not have  any significant chest pain.  She is breathing well.  Not having abdominal  pain, nausea or vomiting.  Her lungs indicate breath sounds were adequate.  Her cardiac examination was unremarkable and she is discharged home in  stable, satisfactory condition.  I spent some good amount of time to  discussing patient workup that was done while she was in hospital and  rationale behind these work-ups.  Also explained to her the need to resist  from the use of marijuana and other substance abuse and she expressed  understanding.  She understands that should she have any recurrence of her  symptoms she would need to call to her PCP or come to the ED for further  evaluation.   DISCHARGE LABORATORIES:  Lymph count of 10.4, hemoglobin 11.5, hematocrit  32.7, MCV 92.1, platelet count 246.  Sodium 136, potassium 3.9, chloride  108, CO2 24, glucose 94, BUN 5, creatinine 0.7.  Her total cholesterol and  lipid panels are pending at time of her admission and she is ready to be  started on Zocor.  Will give her high CRT.   I spent more  than 30 minutes preparing patient for discharge today.       GO/MEDQ  D:  12/16/2003  T:  12/18/2003  Job:  [id number]   cc:   Lyn Records III, M.D.  [address]  [address]  [place]  [place] [address]  Fax: [phone]  Oley Balm. Sung Amabile, M.D. [facility]

## 2010-08-17 NOTE — Cardiovascular Report (Signed)
NAMELORI, POPOWSKI NO.:  [id number]   MEDICAL RECORD NO.:  [id number]          PATIENT TYPE:  OIB   LOCATION:  2899                         FACILITY:  [facility]   PHYSICIAN:  Lyn Records III, M.D.DATE OF BIRTH:  06-19-1970   DATE OF PROCEDURE:  DATE OF DISCHARGE:                              CARDIAC CATHETERIZATION   INDICATIONS FOR PROCEDURE:  The patient is 34, has a family history of  coronary disease, hyperlipidemia, heavy significant smoking habit and  recently a hospitalization for recurring chest and left arm discomfort  relieved by nitroglycerin.  Cardiolite study performed on that admission of  December 15, 2003, was negative for evidence of infarction or ischemia.  She did have a high sensitivity CRP level of 6.2 and her lipid panel was  also mildly elevated.  Because of continued symptoms and responsive chest  discomfort to  nitroglycerin, diagnostic catheterization is being performed  on January 11, 2004.   PROCEDURES:  1.  Left heart catheterization.  2.  Selective coronary angiography.  3.  Left ventriculography.  4.  Angiocele arteriotomy closure.   SURGEON:   DESCRIPTION OF PROCEDURE:  After informed consent, a 6 French sheath is  placed in the right femoral artery using modified Seldinger technique.  A 6  French A2 multipurpose catheter was used for hemodynamics recordings, left  ventriculography by hand injection and selective left and right coronary  angiography. After the first left coronary angiogram, 200 mcg of  intracoronary nitroglycerin was administered and then subsequent angiograms  performed.  We did identify a rather segmental eccentric 30 to 50% narrowing  in the mid LAD.  No high grade obstruction is felt to be present.   Following the procedure, sheathogram was performed of the right femoral and  arteriotomy closure using angiocele was performed.   CONCLUSION:  1.  HEMODYNAMIC DATA:      1.  Aortic pressure 100/69.    2.  Left ventricular pressure 99/9 mmHg.   1.  LEFT VENTRICULOGRAPHY:  Left ventricular cavity size and function are      normal.  Ejection fraction is 60% OMR.   1.  CORONARY ANGIOGRAPHY:      1.  Left main coronary:  Normal.      2.  Left anterior descending coronary:  The left anterior descending is          an average size vessel with distal tapering of the left anterior          descending near the apex.  There is mid left anterior descending          narrowing of up to 50% depending upon the view. This region of          narrowing is eccentric.  Several small diagonals arise from this          vessel and are free of any significant obstruction.      3.  Circumflex artery:  Circumflex artery bifurcates on the left lateral          wall.  This vessel is large and free of  obstruction.      4.  Right coronary artery:  The right coronary artery is large, gives          origin to an left ventricular branch and posterior descending artery          and is normal.   CONCLUSION:  1.  Normal left ventricular function.  2.  A 30 to 50% mid left anterior descending narrowing eccentric, but no      evidence of high grade obstruction.  3.  Recurring symptoms possibly related to superimposed coronary artery      spasm or thrombosis.   PLAN:  Add Plavix.  Continue statin therapy, aspirin and p.r.n. sublingual  nitroglycerin.  Have encouraged her to stop smoking. Will follow closely.       HWS/MEDQ  D:  01/11/2004  T:  01/11/2004  Job:  [id number]   cc:   Carmell Austria, M.D.

## 2010-08-17 NOTE — H&P (Signed)
NAMEROKIA, BOSKET            ACCOUNT NO.:  [id number]   MEDICAL RECORD NO.:  [id number]          PATIENT TYPE:  INP   LOCATION:  1826                         FACILITY:  [facility]   PHYSICIAN:  Ulyses Amor, MD DATE OF BIRTH:  [birth year]   DATE OF ADMISSION:  03/27/2005  DATE OF DISCHARGE:                                HISTORY & PHYSICAL   IDENTIFYING DATA AND CHIEF COMPLAINT:  [name] is a 40 year old  white woman who is admitted to [facility] for further evaluation of  chest pain.   HISTORY OF THE PRESENT ILLNESS:  This patient has a history of chronic chest  pain.  She has previously undergone stress Cardiolite testing, which  demonstrated no evidence of ischemia and an ejection fraction of 69%.  This  was performed in September of last year.  She has also undergone cardiac  catheterization.  By her report she has only a 30% lesion.  Since that time  she has experienced approximately weekly episodes of chest pain.  However,  today she experienced a more prolonged episode of chest pain.  This began  while she was cleaning the house.  The chest pain is described as a pressure  and heaviness across her upper anterior chest.  It did not radiate. It is  associated with dyspnea, but no diaphoresis or nausea.  There were no  exacerbating or ameliorating factors.  It appears not to be related to  position, activity, meals or respirations.  It has lasted over the course of  the day.  She had taken two nitroglycerins, which temporarily eased the  pain, but did not completely relieve it.  Her chest pain continues at this  time, but has significantly abated.   There is no history of myocardial infarction, congestive heart failure or  arrhythmia.   CARDIAC RISK FACTORS:  The patient has a number of risk factors for coronary  artery disease including hypertension, smoking of one pack of cigarettes per  day, and a family history of early coronary artery disease in  her father in  his 37s.  There is no history of diabetes mellitus or dyslipidemia.   PAST MEDICAL HISTORY:  The patient has a number of other medical problems  including bipolar disorder and fibromyalgia.   MEDICATIONS:  Zoloft, Wellbutrin, Ativan and Caduit.   ALLERGIES:  None.   PAST SURGICAL HISTORY:  1.  Hysterectomy.  2.  Cholecystectomy.   ACCIDENTS AND INJURIES:  Significant injuries - none.   SOCIAL HISTORY:  The patient does not work.  She lives with her husband and  two children.  She drinks occasional alcohol.  She smokes cigarettes as  described above.   FAMILY HISTORY:  The family history is notable for coronary artery disease  in her father as described above.   REVIEW OF SYSTEMS:  Review of systems reveals no new problems related to her  head, eyes, ears, nose, mouth, throat, lungs, gastrointestinal system,  genitourinary system, or extremities.  There is no history of neurologic or  psychiatric disorder.  There is no history of fever, chills or weight loss.  PHYSICAL EXAMINATION:  VITAL SIGNS:  Blood pressure 121/87, pulse 88 and  regular, respirations 24, temperature 98.6, and pulse ox 99% on room air.  GENERAL APPEARANCE:  The patient is a young white woman in no discomfort.  She is alert, oriented, appropriate, and responsive.  HEENT:  Head, eyes, nose and mouth are normal.  NECK:  The neck is without thyromegaly or adenopathy.  Carotid pulses are  palpable bilaterally and without bruits.  HEART:  Cardiac examination reveals a normal S1 and S2.  There is no S3, S4,  murmur, rub, or click. Cardiac rhythm is regular.  CHEST:  No chest wall tenderness is noted.  LUNGS:  The lungs are clear.  ABDOMEN:  The abdomen is soft and nontender.  There is no mass,  hepatosplenomegaly, bruits, distention, rebound, guarding, or rigidity.  Bowel sounds are normal.  BREASTS, PELVIC AND RECTAL:  Breast, pelvic and rectal examinations are not  performed as they are not  pertinent to the reason for acute care  hospitalization.  EXTREMITIES:  The extremities are without edema, deviation or deformity.  Radial and dorsalis pedis pulses are palpable bilaterally.  NEUROLOGIC EXAMINATION:  Brief screening neurologic survey is unremarkable.   LABORATORY DATA:  The electrocardiogram was normal.  There is no evidence of  ischemia or infarction.  The chest radiograph according to the radiologist  demonstrated no evidence of acute disease.   Potassium 3.8, BUN less than 3 and creatinine is pending.  The initial set  of cardiac markers revealed a myoglobin of 72, CK/MB 2.5 and troponin 0.07.  The second set of cardiac markers revealed a myoglobin of 90.6, CK/MB less  than 1.0 and troponin less than 0.05.  Fibrin derivatives were less than  0.22.  White count 9.3 with a hemoglobin of 13.0 and hematocrit 37.9.  The  remaining studies were pending at the time of this dictation.   IMPRESSION:  1.  Chest pain rule out angina.  Troponins are 0.07 and less than 0.05.  2.  Nonobstructive coronary artery disease (30%) by cardiac catheterization      by the patient's history; records are pending.  Normal stress Cardiolite      with a normal ejection fraction.  3.  Hypertension.  4.  Fibromyalgia.  5.  Bipolar disorder.   PLAN:  1.  Telemetry.  2.  Serial cardiac enzymes.  3.  Aspirin.  4.  Intravenous heparin.  5.  Intravenous nitroglycerin.  6.  Discontinuation of smoking discussed with the patient.  7.  Further measures per Dr. Katrinka Blazing.   The patient's husband was present throughout the patient encounter.      Ulyses Amor, MD  Electronically Signed     MSC/MEDQ  D:  03/27/2005  T:  03/28/2005  Job:  [id number]

## 2010-09-10 ENCOUNTER — Ambulatory Visit: Payer: Medicare Other | Admitting: Family Medicine

## 2010-09-10 ENCOUNTER — Encounter (INDEPENDENT_AMBULATORY_CARE_PROVIDER_SITE_OTHER): Payer: Medicare Other | Admitting: Psychiatry

## 2010-09-10 DIAGNOSIS — F411 Generalized anxiety disorder: Secondary | ICD-10-CM

## 2010-09-10 DIAGNOSIS — F339 Major depressive disorder, recurrent, unspecified: Secondary | ICD-10-CM

## 2010-09-21 ENCOUNTER — Encounter (HOSPITAL_COMMUNITY): Payer: Medicare Other | Admitting: Licensed Clinical Social Worker

## 2010-10-04 ENCOUNTER — Other Ambulatory Visit: Payer: Self-pay | Admitting: Family Medicine

## 2010-10-18 ENCOUNTER — Encounter: Payer: Self-pay | Admitting: Family Medicine

## 2010-10-19 ENCOUNTER — Ambulatory Visit (INDEPENDENT_AMBULATORY_CARE_PROVIDER_SITE_OTHER): Payer: Medicare Other | Admitting: Family Medicine

## 2010-10-19 ENCOUNTER — Telehealth: Payer: Self-pay | Admitting: Family Medicine

## 2010-10-19 ENCOUNTER — Encounter: Payer: Self-pay | Admitting: Family Medicine

## 2010-10-19 ENCOUNTER — Ambulatory Visit
Admission: RE | Admit: 2010-10-19 | Discharge: 2010-10-19 | Disposition: A | Discharge status: Home / Self Care | Payer: Medicare Other | Source: Ambulatory Visit | Attending: Family Medicine | Admitting: Family Medicine

## 2010-10-19 DIAGNOSIS — I10 Essential (primary) hypertension: Secondary | ICD-10-CM

## 2010-10-19 DIAGNOSIS — R0789 Other chest pain: Secondary | ICD-10-CM

## 2010-10-19 DIAGNOSIS — R071 Chest pain on breathing: Secondary | ICD-10-CM

## 2010-10-19 DIAGNOSIS — Z1231 Encounter for screening mammogram for malignant neoplasm of breast: Secondary | ICD-10-CM

## 2010-10-19 DIAGNOSIS — E785 Hyperlipidemia, unspecified: Secondary | ICD-10-CM

## 2010-10-19 LAB — COMPLETE METABOLIC PANEL WITH GFR
ALT: 9 U/L (ref 0–35)
AST: 14 U/L (ref 0–37)
Alkaline Phosphatase: 105 U/L (ref 39–117)
Creat: 0.79 mg/dL (ref 0.50–1.10)
GFR, Est African American: >60 mL/min (ref >60)
Sodium: 140 mEq/L (ref 135–145)
Total Bilirubin: 0.3 mg/dL (ref 0.3–1.2)
Total Protein: 7.6 g/dL (ref 6.0–8.3)

## 2010-10-19 LAB — CBC WITH DIFFERENTIAL/PLATELET
Basophils Absolute: 0 10*3/uL (ref 0.0–0.1)
Basophils Relative: 0 % (ref 0–1)
Eosinophils Absolute: 0.1 10*3/uL (ref 0.0–0.7)
Eosinophils Relative: 1 % (ref 0–5)
MCH: 32.1 pg (ref 26.0–34.0)
MCV: 94.1 fL (ref 78.0–100.0)
Neutrophils Relative %: 65 % (ref 43–77)
Platelets: 259 10*3/uL (ref 150–400)
RBC: 4.08 MIL/uL (ref 3.87–5.11)
RDW: 13 % (ref 11.5–15.5)
WBC: 11.2 10*3/uL — ABNORMAL HIGH (ref 4.0–10.5)

## 2010-10-19 LAB — LIPID PANEL
HDL: 26 mg/dL — ABNORMAL LOW (ref >39)
LDL Cholesterol: 88 mg/dL (ref 0–99)
Total CHOL/HDL Ratio: 6.2 Ratio
Triglycerides: 240 mg/dL — ABNORMAL HIGH (ref <150)
VLDL: 48 mg/dL — ABNORMAL HIGH (ref 0–40)

## 2010-10-19 NOTE — Telephone Encounter (Signed)
Pls let pt know that her CXR came back normal.

## 2010-10-19 NOTE — Telephone Encounter (Signed)
Pt aware of the above  

## 2010-10-19 NOTE — Telephone Encounter (Signed)
Pls let pt know that all of her labs came back normal other than high TGs and low HDL.  Make sure taking Crestor every night and add OTC Omega 3 Fish Oil 4 grams / day.  No sign of heart disease. Proceed with mammogram and call if chest wall pain has not resolved in 2 wks.

## 2010-10-19 NOTE — Patient Instructions (Signed)
Labs first then CXR downstairs today.  Will call you w/ results on Monday.

## 2010-10-19 NOTE — Assessment & Plan Note (Signed)
Will obtain CXR esp since she is a smoker with concurrent cough and rhonchi.  Due for mammogram anyway, so scheduled though her pain is over the chest wall and not the breast.  Due for fasting labs today anyway, so did add cardiac enzymes to her fasting labs though I did reassure her that she has atypical chest wall, R sided pain.

## 2010-10-19 NOTE — Progress Notes (Signed)
  Subjective:    Patient ID: [name], female    DOB: 03-05-1971, 40 y.o.   MRN: [id number]  HPI 40 yo WF presents for f/u visit.  Due for fasting labs.  Still trying to quit smoking.  Notes a dry cough and R sided lateral chest wall pain x 2 mos.  Denies hemoptysis, SOB or sputum production.  Tender in the R axillae and R breast.  Due for a mammogram.  Hx of fibrocystic breast tissue.  Denies chest wall trauma or worsening symptoms with deep breathing or trunk/ arm movement.  Denies palpitations.  O/w feels well.  BP 119/77  Pulse 88  Ht 5\' 7"  (1.702 m)  Wt 171 lb (77.565 kg)  BMI 26.78 kg/m2  SpO2 99%   Review of Systems  Constitutional: Negative for fever, appetite change and fatigue.  Respiratory: Positive for cough. Negative for chest tightness, shortness of breath and wheezing.   Cardiovascular: Negative for chest pain.  Musculoskeletal: Negative for back pain.       Objective:   Physical Exam  Constitutional: She appears well-developed and well-nourished.  HENT:  Mouth/Throat: Oropharynx is clear and moist.  Neck: Neck supple.  Cardiovascular: Normal rate, regular rhythm and normal heart sounds.   Pulmonary/Chest: Effort normal. She has wheezes (diffuse with slight l lower rhoncji). She exhibits tenderness (right mid axillary line). She exhibits no mass. Right breast exhibits tenderness (with fibroglandular texture).  Abdominal: Soft. Bowel sounds are normal. She exhibits no distension. There is no tenderness.  Lymphadenopathy:    She has no cervical adenopathy.    She has no axillary adenopathy.  Skin: Skin is warm and dry.  Psychiatric: She has a normal mood and affect.

## 2010-10-19 NOTE — Telephone Encounter (Signed)
[facility] lab calling to let us know they are faxing stat labs on this pt to the clinical fax # now. @ 2:25pm

## 2010-10-22 ENCOUNTER — Telehealth: Payer: Self-pay | Admitting: Family Medicine

## 2010-10-23 NOTE — Telephone Encounter (Signed)
Closed

## 2010-10-30 ENCOUNTER — Ambulatory Visit
Admission: RE | Admit: 2010-10-30 | Discharge: 2010-10-30 | Disposition: A | Discharge status: Home / Self Care | Payer: Medicare Other | Source: Ambulatory Visit | Attending: Family Medicine | Admitting: Family Medicine

## 2010-10-30 DIAGNOSIS — Z1231 Encounter for screening mammogram for malignant neoplasm of breast: Secondary | ICD-10-CM

## 2010-12-04 ENCOUNTER — Other Ambulatory Visit: Payer: Self-pay | Admitting: *Deleted

## 2010-12-04 MED ORDER — LISINOPRIL 10 MG PO TABS: 10.0000 mg | ORAL_TABLET | Freq: Every day | ORAL | Status: DC

## 2010-12-10 ENCOUNTER — Other Ambulatory Visit: Payer: Self-pay | Admitting: *Deleted

## 2010-12-10 MED ORDER — LISINOPRIL 10 MG PO TABS: 10.0000 mg | ORAL_TABLET | Freq: Every day | ORAL | Status: DC

## 2010-12-11 ENCOUNTER — Encounter (HOSPITAL_COMMUNITY): Payer: Medicare Other | Admitting: Psychiatry

## 2010-12-20 ENCOUNTER — Encounter (INDEPENDENT_AMBULATORY_CARE_PROVIDER_SITE_OTHER): Payer: Medicare Other | Admitting: Psychiatry

## 2010-12-20 DIAGNOSIS — F411 Generalized anxiety disorder: Secondary | ICD-10-CM

## 2010-12-20 DIAGNOSIS — F339 Major depressive disorder, recurrent, unspecified: Secondary | ICD-10-CM

## 2011-01-01 ENCOUNTER — Other Ambulatory Visit: Payer: Self-pay | Admitting: *Deleted

## 2011-01-01 MED ORDER — CYCLOBENZAPRINE HCL 10 MG PO TABS: 10.0000 mg | ORAL_TABLET | Freq: Every evening | ORAL | Status: DC | PRN

## 2011-01-01 MED ORDER — PANTOPRAZOLE SODIUM 40 MG PO TBEC: 40.0000 mg | DELAYED_RELEASE_TABLET | Freq: Every day | ORAL | Status: DC

## 2011-01-02 ENCOUNTER — Other Ambulatory Visit: Payer: Self-pay | Admitting: *Deleted

## 2011-01-02 MED ORDER — METOPROLOL SUCCINATE ER 50 MG PO TB24: 50.0000 mg | ORAL_TABLET | Freq: Two times a day (BID) | ORAL | Status: DC

## 2011-01-08 ENCOUNTER — Other Ambulatory Visit: Payer: Self-pay | Admitting: Family Medicine

## 2011-01-09 NOTE — Telephone Encounter (Signed)
Closed

## 2011-01-10 ENCOUNTER — Other Ambulatory Visit: Payer: Self-pay | Admitting: *Deleted

## 2011-01-10 MED ORDER — METOPROLOL SUCCINATE ER 50 MG PO TB24: 50.0000 mg | ORAL_TABLET | Freq: Two times a day (BID) | ORAL | Status: DC

## 2011-01-16 ENCOUNTER — Telehealth: Payer: Self-pay | Admitting: Family Medicine

## 2011-01-16 NOTE — Telephone Encounter (Signed)
Patient called left a voice message she is having problems with a script that Dr. Linford Arnold prescribed for her. Pt states that the pills dont look like the same pills she was taking when she saw Dr. Cathey Endow request to have a nurse call her back. Med is Metoprolol

## 2011-01-17 ENCOUNTER — Other Ambulatory Visit: Payer: Self-pay | Admitting: *Deleted

## 2011-01-17 MED ORDER — METOPROLOL SUCCINATE ER 50 MG PO TB24: 50.0000 mg | ORAL_TABLET | Freq: Two times a day (BID) | ORAL | Status: DC

## 2011-01-17 NOTE — Telephone Encounter (Signed)
Called and spoke with pt

## 2011-03-06 ENCOUNTER — Other Ambulatory Visit: Payer: Self-pay | Admitting: *Deleted

## 2011-03-06 MED ORDER — METOPROLOL SUCCINATE ER 50 MG PO TB24: 50.0000 mg | ORAL_TABLET | Freq: Two times a day (BID) | ORAL | Status: DC

## 2011-04-03 ENCOUNTER — Other Ambulatory Visit (HOSPITAL_COMMUNITY): Payer: Self-pay | Admitting: Psychiatry

## 2011-04-03 MED ORDER — ALPRAZOLAM 0.5 MG PO TABS: 0.5000 mg | ORAL_TABLET | Freq: Three times a day (TID) | ORAL | Status: DC | PRN

## 2011-04-12 ENCOUNTER — Encounter (HOSPITAL_COMMUNITY): Payer: Self-pay

## 2011-04-15 ENCOUNTER — Encounter (HOSPITAL_COMMUNITY): Payer: Self-pay | Admitting: Psychiatry

## 2011-04-15 ENCOUNTER — Ambulatory Visit (INDEPENDENT_AMBULATORY_CARE_PROVIDER_SITE_OTHER): Payer: Medicare Other | Admitting: Psychiatry

## 2011-04-15 VITALS — BP 112/72 | Ht 67.0 in | Wt 178.0 lb

## 2011-04-15 DIAGNOSIS — F411 Generalized anxiety disorder: Secondary | ICD-10-CM | POA: Insufficient documentation

## 2011-04-15 DIAGNOSIS — F329 Major depressive disorder, single episode, unspecified: Secondary | ICD-10-CM

## 2011-04-15 MED ORDER — VENLAFAXINE HCL ER 37.5 MG PO CP24: 37.5000 mg | ORAL_CAPSULE | Freq: Every day | ORAL | Status: DC
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
## 2011-05-28 ENCOUNTER — Encounter (HOSPITAL_COMMUNITY): Payer: Self-pay | Admitting: Psychiatry

## 2011-05-28 ENCOUNTER — Ambulatory Visit (INDEPENDENT_AMBULATORY_CARE_PROVIDER_SITE_OTHER): Payer: Medicare Other | Admitting: Psychiatry

## 2011-05-28 ENCOUNTER — Other Ambulatory Visit (HOSPITAL_COMMUNITY): Payer: Self-pay | Admitting: Psychiatry

## 2011-05-28 VITALS — BP 122/82 | Ht 67.0 in | Wt 173.0 lb

## 2011-05-28 DIAGNOSIS — F411 Generalized anxiety disorder: Secondary | ICD-10-CM

## 2011-05-28 DIAGNOSIS — F329 Major depressive disorder, single episode, unspecified: Secondary | ICD-10-CM

## 2011-05-28 MED ORDER — VENLAFAXINE HCL ER 75 MG PO CP24: 75.0000 mg | ORAL_CAPSULE | Freq: Every day | ORAL | Status: DC

## 2011-05-28 NOTE — Progress Notes (Signed)
   [facility] Behavioral Health Follow-up Outpatient Visit  [name] Aug 07, 1970   Subjective: The patient is a 41 year old female who has been followed by [facility] since March of 2010. She is currently diagnosed with generalized anxiety disorder along with major depressive disorder. At last appointment, I discontinued her Viibrid and put her on Effexor XR. She is tolerating this well. She reports that things are good at home, and she blames herself. She states that her 2 sons her acting much better. Her older son appears to have matured with his 21st birthday. Her younger son is taking courses online. She still has issues with her husband. Her mom has moved 2 houses away. Patient reports that she tends to go into rages. She becomes very emotional. She states that her anxiety overwhelms her at times. She's not hungry. She is actually down 5 pounds, which is a good thing. She sleeps well with the trazodone.  Filed Vitals:   05/28/11 1239  BP: 122/82    Mental Status Examination  Appearance: Casually dressed Alert: Yes Attention: good  Cooperative: Yes Eye Contact: Good Speech: Regular rate rhythm and volume Psychomotor Activity: Normal Memory/Concentration: Intact Oriented: person, place, time/date and situation Mood: Euthymic Affect: Full Range Thought Processes and Associations: Logical Fund of Knowledge: Fair Thought Content: No suicidal or homicidal thoughts Insight: Fair Judgement: Fair  Diagnosis: NOS anxiety disorder, major depressive disorder  Treatment Plan: At this point we will increase the Effexor XR to 75 mg daily. I will see her back in 6 weeks. Patient to call with concerns. Jamse Mead, MD

## 2011-06-25 ENCOUNTER — Ambulatory Visit (HOSPITAL_COMMUNITY): Payer: Self-pay | Admitting: Psychiatry

## 2011-06-26 ENCOUNTER — Other Ambulatory Visit: Payer: Self-pay | Admitting: *Deleted

## 2011-06-26 MED ORDER — GABAPENTIN 300 MG PO CAPS: 300.0000 mg | ORAL_CAPSULE | Freq: Three times a day (TID) | ORAL | Status: DC

## 2011-07-20 IMAGING — CR DG ABDOMEN ACUTE W/ 1V CHEST
3 series · 3 of 3 positions shown · non-contrast
Comparison: 01/22/2004

CLINICAL DATA: Fall.  Left side pain.

ACUTE ABDOMEN SERIES (ABDOMEN 2 VIEW & CHEST 1 VIEW)

[view not recorded (1 of 3)]
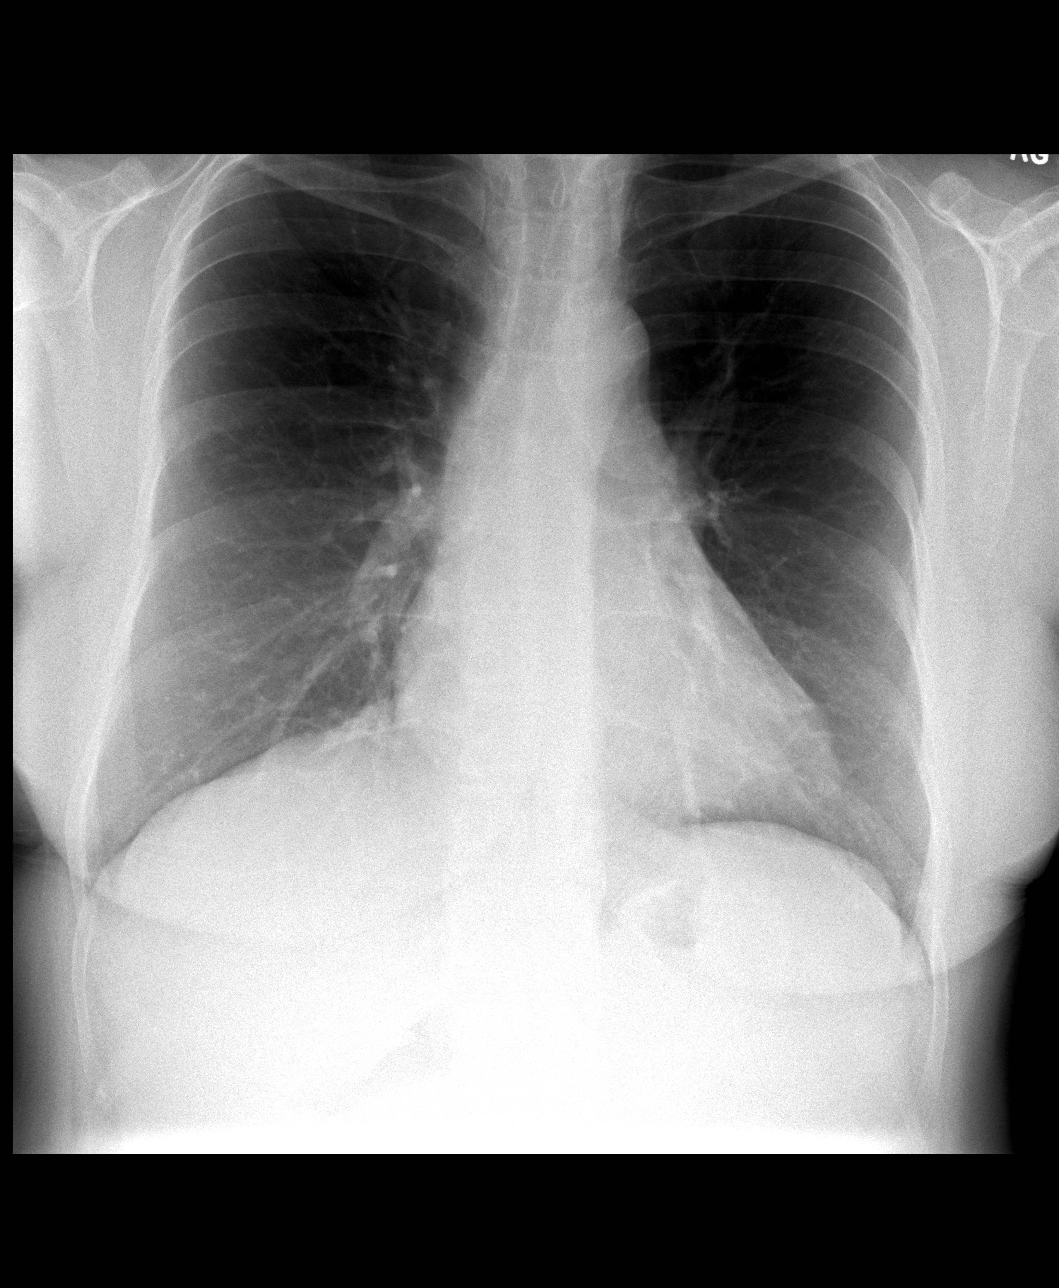

[view not recorded (2 of 3)]
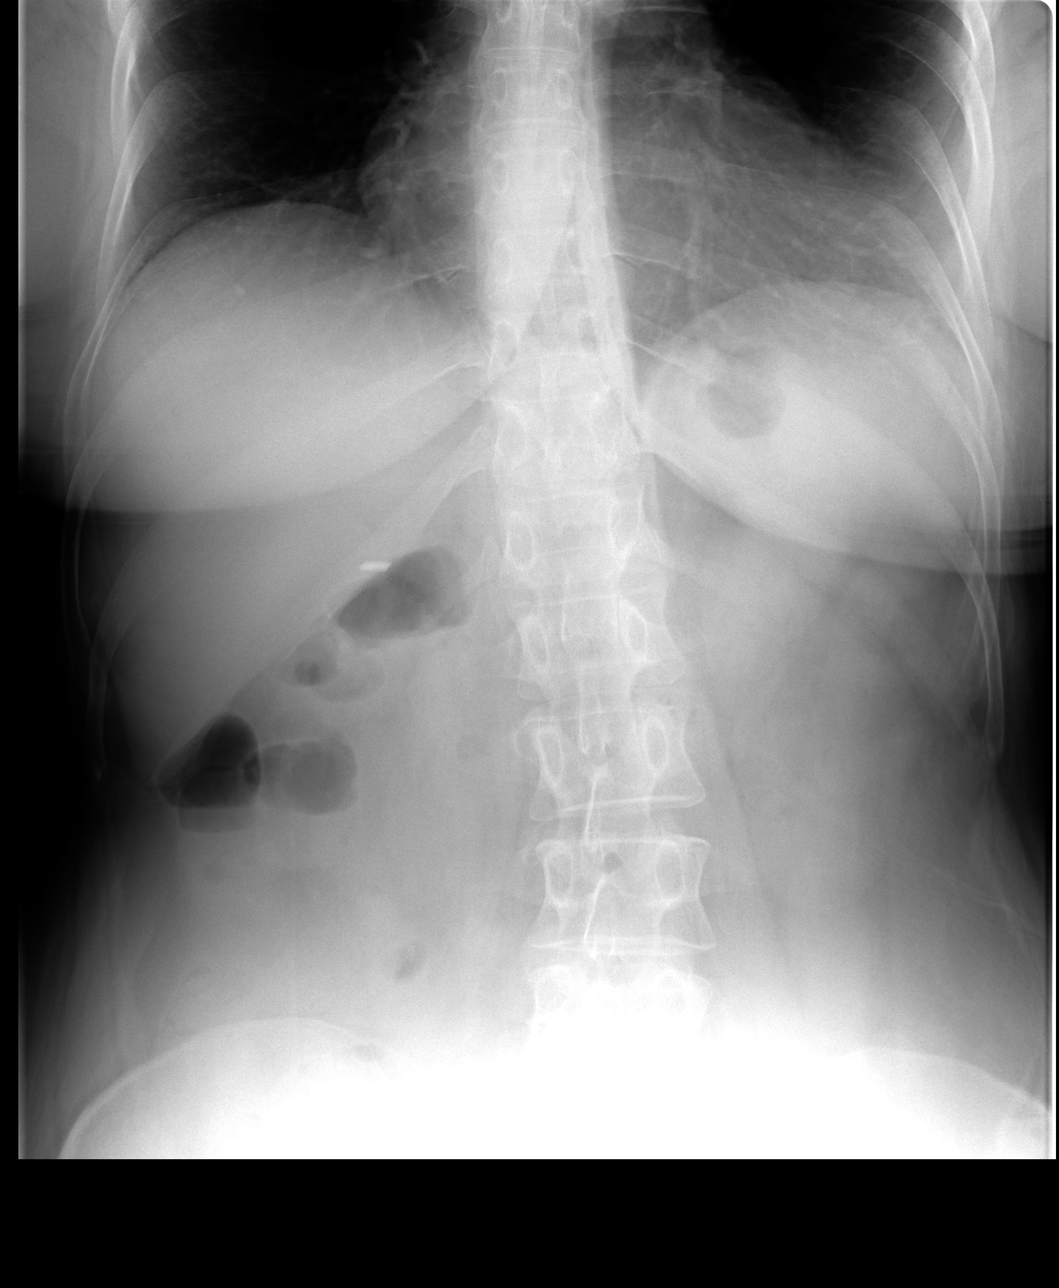

[view not recorded (3 of 3)]
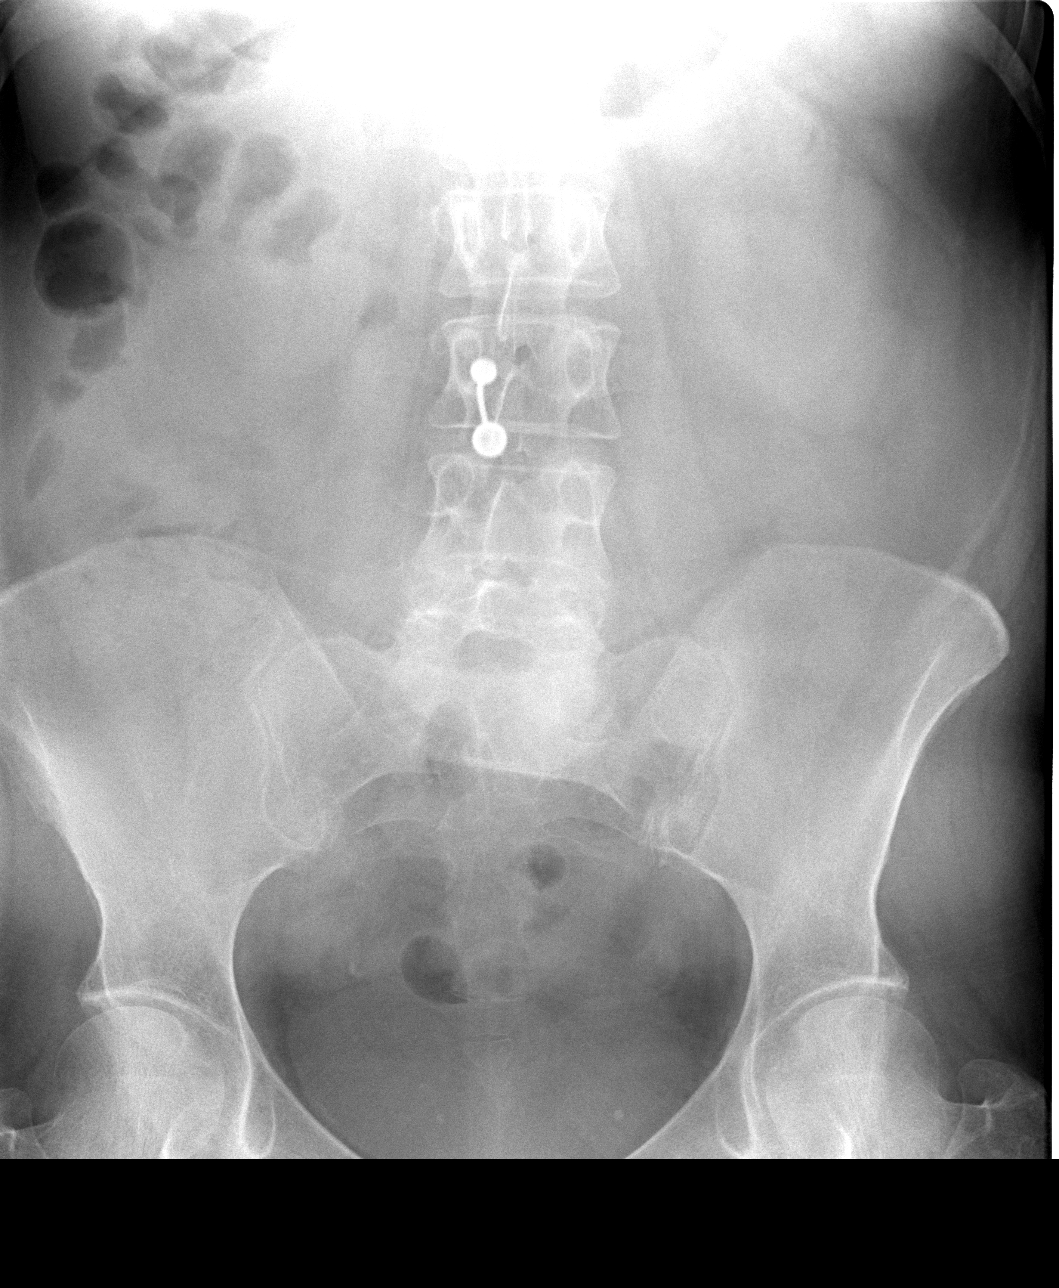

[3 of 3 positions shown; findings below may reference images not displayed]

FINDINGS: The lungs are clear without focal consolidation, edema,
effusion or pneumothorax.  Cardiopericardial silhouette is within
normal limits for size.  Imaged bony structures of the thorax are
intact.  Specifically, there is no evidence for left-sided rib
fracture or left pneumothorax.

Upright film shows no evidence for intraperitoneal free air. There
is no evidence for gaseous bowel dilation to suggest obstruction.
IMPRESSION: No acute cardiopulmonary process.

Normal bowel gas pattern.

## 2011-07-30 ENCOUNTER — Encounter (HOSPITAL_COMMUNITY): Payer: Self-pay | Admitting: Psychiatry

## 2011-07-30 ENCOUNTER — Ambulatory Visit (INDEPENDENT_AMBULATORY_CARE_PROVIDER_SITE_OTHER): Payer: Medicare Other | Admitting: Psychiatry

## 2011-07-30 VITALS — BP 122/82 | Ht 67.0 in | Wt 171.0 lb

## 2011-07-30 DIAGNOSIS — F329 Major depressive disorder, single episode, unspecified: Secondary | ICD-10-CM

## 2011-07-30 DIAGNOSIS — F411 Generalized anxiety disorder: Secondary | ICD-10-CM

## 2011-07-30 MED ORDER — VENLAFAXINE HCL ER 150 MG PO CP24: 150.0000 mg | ORAL_CAPSULE | Freq: Every day | ORAL | Status: DC

## 2011-07-30 NOTE — Progress Notes (Signed)
   [facility] Behavioral Health Follow-up Outpatient Visit  NECHAMA ESCUTIA 1970-05-31   Subjective: The patient is a 41 year old female who has been followed by [facility] since March of 2010. She is currently diagnosed with generalized anxiety disorder along with major depressive disorder. At last appointment, I increased her Effexor XR to 75 mg daily. I continued everything else. Patient shows up late today. He had to get a ride by her son. Been having car trouble. The patient reports some mild heart palpitations, but states that they only appear that she has anxiety. Her youngest son is getting on her nerves. She suspects older son of doing drugs in the house. She reports that she will not give him at the house, so she has not been taking her trazodone as much to she will have to get up in the middle the night to let him in. She reports that she still having discord with her husband. They don't communicate. Nor do they have sex. Her mom is pretty much leaving her alone. She will only call if she needs to prefers to do something. She is down weekend today. She thought she would be up.  Filed Vitals:   07/30/11 1043  BP: 122/82    Mental Status Examination  Appearance: Casually dressed Alert: Yes Attention: good  Cooperative: Yes Eye Contact: Good Speech: Regular rate rhythm and volume Psychomotor Activity: Normal Memory/Concentration: Intact Oriented: person, place, time/date and situation Mood: Euthymic Affect: Full Range Thought Processes and Associations: Logical Fund of Knowledge: Fair Thought Content: No suicidal or homicidal thoughts Insight: Fair Judgement: Fair  Diagnosis: NOS anxiety disorder, major depressive disorder  Treatment Plan: At this point we will increase the Effexor XR to 150 mg daily. I will see her back in 6 weeks. Patient to call with concerns. Jamse Mead, MD

## 2011-08-08 ENCOUNTER — Other Ambulatory Visit (HOSPITAL_COMMUNITY): Payer: Self-pay | Admitting: Psychiatry

## 2011-08-08 MED ORDER — ALPRAZOLAM 0.5 MG PO TABS: 0.5000 mg | ORAL_TABLET | Freq: Three times a day (TID) | ORAL | Status: DC | PRN

## 2011-08-28 ENCOUNTER — Other Ambulatory Visit (HOSPITAL_COMMUNITY): Payer: Self-pay | Admitting: Psychiatry

## 2011-08-28 MED ORDER — AMITRIPTYLINE HCL 50 MG PO TABS: 50.0000 mg | ORAL_TABLET | Freq: Every day | ORAL | Status: DC

## 2011-08-30 ENCOUNTER — Other Ambulatory Visit (HOSPITAL_COMMUNITY): Payer: Self-pay | Admitting: Psychiatry

## 2011-09-10 ENCOUNTER — Emergency Department: Admit: 2011-09-10 | Discharge: 2011-09-10 | Disposition: A | Discharge status: Home / Self Care | Payer: Medicare Other

## 2011-09-10 ENCOUNTER — Emergency Department
Admission: EM | Admit: 2011-09-10 | Discharge: 2011-09-10 | Disposition: A | Discharge status: Home / Self Care | Payer: Medicare Other | Source: Home / Self Care | Attending: Family Medicine | Admitting: Family Medicine

## 2011-09-10 ENCOUNTER — Encounter: Payer: Self-pay | Admitting: *Deleted

## 2011-09-10 DIAGNOSIS — S83411A Sprain of medial collateral ligament of right knee, initial encounter: Secondary | ICD-10-CM

## 2011-09-10 DIAGNOSIS — M25569 Pain in unspecified knee: Secondary | ICD-10-CM

## 2011-09-10 DIAGNOSIS — L03115 Cellulitis of right lower limb: Secondary | ICD-10-CM

## 2011-09-10 DIAGNOSIS — S83419A Sprain of medial collateral ligament of unspecified knee, initial encounter: Secondary | ICD-10-CM

## 2011-09-10 MED ORDER — HYDROCODONE-ACETAMINOPHEN 5-500 MG PO TABS: 1.0000 | ORAL_TABLET | Freq: Every evening | ORAL | Status: AC | PRN

## 2011-09-10 MED ORDER — CEPHALEXIN 500 MG PO CAPS: 500.0000 mg | ORAL_CAPSULE | Freq: Three times a day (TID) | ORAL | Status: AC

## 2011-09-10 NOTE — Discharge Instructions (Signed)
Wear ace wrap daily until swelling resolves, then may switch to a hinged knee brace.   Use crutches for about 5 days.  Continue applying ice pack 2 or 3 times daily.  Continue Ibuprofen 200mg , 4 tabs every 8 hours with food.   Begin range of motion and stretching exercises in about 5 days.  Change bandage daily and apply Bacitracin ointment.

## 2011-09-10 NOTE — ED Notes (Signed)
Patient twisted and fell onto her right knee 1 week ago. Swelling and pain present. She has used ice, IBF and Aleve for pain.

## 2011-09-10 NOTE — ED Provider Notes (Signed)
History     CSN: [id number]  Arrival date & time 09/10/11  1219   First MD Initiated Contact with Patient 09/10/11 1253      Chief Complaint  Patient presents with  . Knee Injury    right      HPI Comments: While exiting a door about one week ago, patient did not realize that there was a single step down and she fell, twisting her right knee and striking the anterior aspect of the knee.  She felt a popping sensation followed by swelling.  She has had persistent pain with weight bearing.  She also received a small laceration to the anterior knee which she has been dressing with a topical antibiotic.  Patient is a 41 y.o. female presenting with knee pain. The history is provided by the patient.  Knee Pain This is a new problem. Episode onset: 1 week ago. The problem occurs constantly. The problem has not changed since onset.Associated symptoms comments: none. The symptoms are aggravated by walking and standing. The symptoms are relieved by nothing. Treatments tried: ice and ibuprofen. The treatment provided mild relief.    Past Medical History  Diagnosis Date  . Bipolar 1 disorder   . Menopausal state   . Fibromyalgia   . CAD (coronary artery disease)   . Hyperlipidemia   . Hypertension   . Chronic back pain   . GERD (gastroesophageal reflux disease)   . RLS (restless legs syndrome)   . Intervertebral disc protrusion   . Depression     on diability    Past Surgical History  Procedure Date  . Abdominal hysterectomy     TAH w/oophorectomy for DUB/ovarian cysts.    Family History  Problem Relation Age of Onset  . Heart attack Father   . Stroke Mother   . Deep vein thrombosis Mother   . Hyperlipidemia Mother   . Lupus      grandmother and cousin  . Alcohol abuse Father          History  Substance Use Topics  . Smoking status: Current Everyday Smoker -- 1.0 packs/day for 10 years    Types: Cigarettes  . Smokeless tobacco: Never Used  . Alcohol Use: 2.0 oz/week    4 drink(s) per week     per week    OB History    Grav Para Term Preterm Abortions TAB SAB Ect Mult Living                  Review of Systems  All other systems reviewed and are negative.    Allergies  Diphenhydramine hcl and Pregabalin  Home Medications   Current Outpatient Rx  Name Route Sig Dispense Refill  . ALPRAZOLAM 0.5 MG PO TABS Oral Take 1 tablet (0.5 mg total) by mouth 3 (three) times daily as needed. 90 tablet 2  . AMITRIPTYLINE HCL 50 MG PO TABS Oral Take 1 tablet (50 mg total) by mouth daily. 90 tablet 0  . CEPHALEXIN 500 MG PO CAPS Oral Take 1 capsule (500 mg total) by mouth 3 (three) times daily. 30 capsule 0  . CRESTOR 20 MG PO TABS  TAKE 1 TABLET BY MOUTH AT BEDTIME 30 tablet 2  . CYCLOBENZAPRINE HCL 10 MG PO TABS Oral Take 1 tablet (10 mg total) by mouth at bedtime as needed. 30 tablet 1  . GABAPENTIN 300 MG PO CAPS Oral Take 1 capsule (300 mg total) by mouth 3 (three) times daily. 90 capsule 2  .  HYDROCODONE-ACETAMINOPHEN 5-500 MG PO TABS Oral Take 1 tablet by mouth at bedtime as needed for pain. 10 tablet 0  . MELOXICAM 15 MG PO TABS Oral Take 15 mg by mouth daily.      Marland Kitchen METOPROLOL SUCCINATE ER 50 MG PO TB24 Oral Take 1 tablet (50 mg total) by mouth 2 (two) times daily. 180 tablet 0  . PANTOPRAZOLE SODIUM 40 MG PO TBEC Oral Take 1 tablet (40 mg total) by mouth daily. 30 tablet 5  . PROAIR HFA 108 (90 BASE) MCG/ACT IN AERS  INHALE 2 PUFFS BY MOUTH EVERY 6 HOURS AS NEEDED 1 Inhaler 2  . TOPIRAMATE 100 MG PO TABS Oral Take 10 mg by mouth. Take 1 1/2 pills of the 100 mg pills once daily.     . TRAZODONE HCL 50 MG PO TABS Oral Take 50 mg by mouth at bedtime.      . VENLAFAXINE HCL ER 150 MG PO CP24 Oral Take 1 capsule (150 mg total) by mouth daily. 90 capsule 0    **Patient requests 90 day supply**    BP 122/85  Pulse 84  Resp 16  Ht 5\' 7"  (1.702 m)  Wt 167 lb (75.751 kg)  BMI 26.16 kg/m2  SpO2 97%  Physical Exam  Nursing note and vitals  reviewed. Constitutional: She is oriented to person, place, and time. She appears well-developed and well-nourished. No distress.  HENT:  Head: Atraumatic.  Eyes: Conjunctivae are normal. Pupils are equal, round, and reactive to light.  Musculoskeletal: She exhibits tenderness.       Right knee: She exhibits decreased range of motion, swelling, laceration, erythema and bony tenderness. She exhibits no effusion, no ecchymosis, no deformity, normal alignment, no LCL laxity, normal patellar mobility, normal meniscus and no MCL laxity. tenderness found. MCL tenderness noted. No medial joint line, no lateral joint line, no LCL and no patellar tendon tenderness noted.       Legs:      Over the right mid-patella is a healing 1cm laceration surrounded by erythema and mild tenderness, about 2.5 by 3cm.  No swelling around wound.  No purulent discharge.  Neurological: She is oriented to person, place, and time.  Skin: Skin is warm and dry.    ED Course  Procedures  none  Labs Reviewed - No data to display Dg Knee Complete 4 Views Right  09/10/2011  *RADIOLOGY REPORT*  Clinical Data: Twisting knee injury 1 week ago.  RIGHT KNEE - COMPLETE 4+ VIEW  Comparison: None.  Findings: No fracture or knee effusion is observed.  No acute bony findings are noted.  Minimal degenerative spurring of the patella noted.  IMPRESSION:  1.  Minimal degenerative patellar spurring.  No acute bony findings.  If symptoms persist despite conservative therapy, MRI followup may be warranted.  Original Report Authenticated By: Dellia Cloud, M.D.     1. Knee pain   2. Sprain of medial collateral ligament of right knee   3. Cellulitis of right knee       MDM  Wound dressed with Bacitracin and bandage.  Ace wrap applied.  Begin Keflex. Recommend using crutches (patient has at home). Wear ace wrap daily until swelling resolves, then may switch to a hinged knee brace.   Use crutches for about 5 days.  Continue applying  ice pack 2 or 3 times daily.  Continue Ibuprofen 200mg , 4 tabs every 8 hours with food.   Begin range of motion and stretching exercises in about 5  days [employer] information and instruction handout given).  Change bandage daily and apply Bacitracin ointment. Followup with [facility] if not improving about two weeks.        Lattie Haw, MD 09/10/11 [phone]

## 2011-09-11 ENCOUNTER — Encounter (HOSPITAL_COMMUNITY): Payer: Self-pay | Admitting: Psychiatry

## 2011-09-11 NOTE — Progress Notes (Signed)
This encounter was created in error - please disregard.

## 2011-09-24 ENCOUNTER — Other Ambulatory Visit: Payer: Self-pay | Admitting: *Deleted

## 2011-09-24 MED ORDER — GABAPENTIN 300 MG PO CAPS: 300.0000 mg | ORAL_CAPSULE | Freq: Three times a day (TID) | ORAL | Status: DC

## 2011-11-24 ENCOUNTER — Other Ambulatory Visit: Payer: Self-pay | Admitting: Family Medicine

## 2011-12-21 ENCOUNTER — Other Ambulatory Visit: Payer: Self-pay | Admitting: Family Medicine

## 2012-01-03 ENCOUNTER — Encounter (HOSPITAL_COMMUNITY): Payer: Self-pay | Admitting: Emergency Medicine

## 2012-01-03 HISTORY — DX: Migraine, unspecified, not intractable, without status migrainosus: G43.909

## 2012-03-04 IMAGING — CR DG CHEST 2V
2 series · 2 of 2 positions shown · non-contrast
Comparison: [HOSPITAL] chest x-ray 12/13/2003, chest CT
12/13/2003, [HOSPITAL] chest x-ray 12/15/2003, 03/26/2005
and 10/13/2008.

CLINICAL DATA: Right-sided chest pain, cough, smoker.

CHEST - 2 VIEW

[view not recorded (1 of 2)]
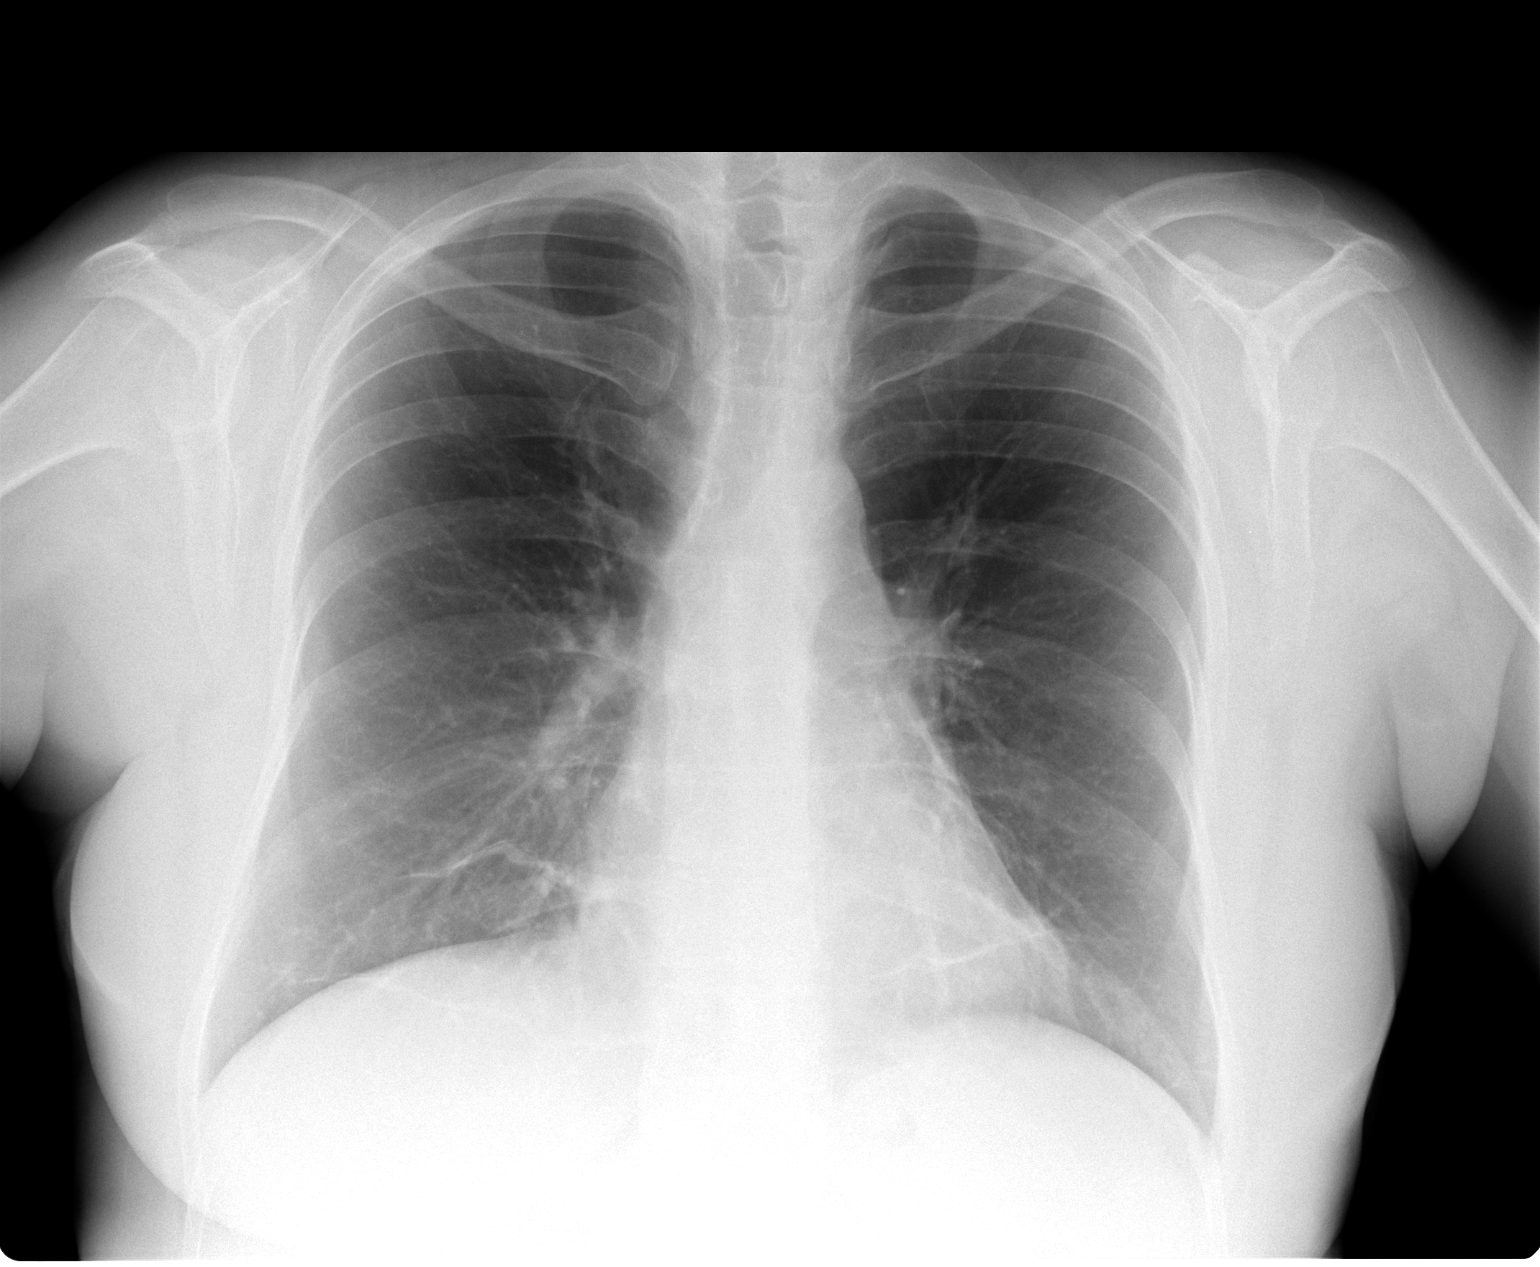

[view not recorded (2 of 2)]
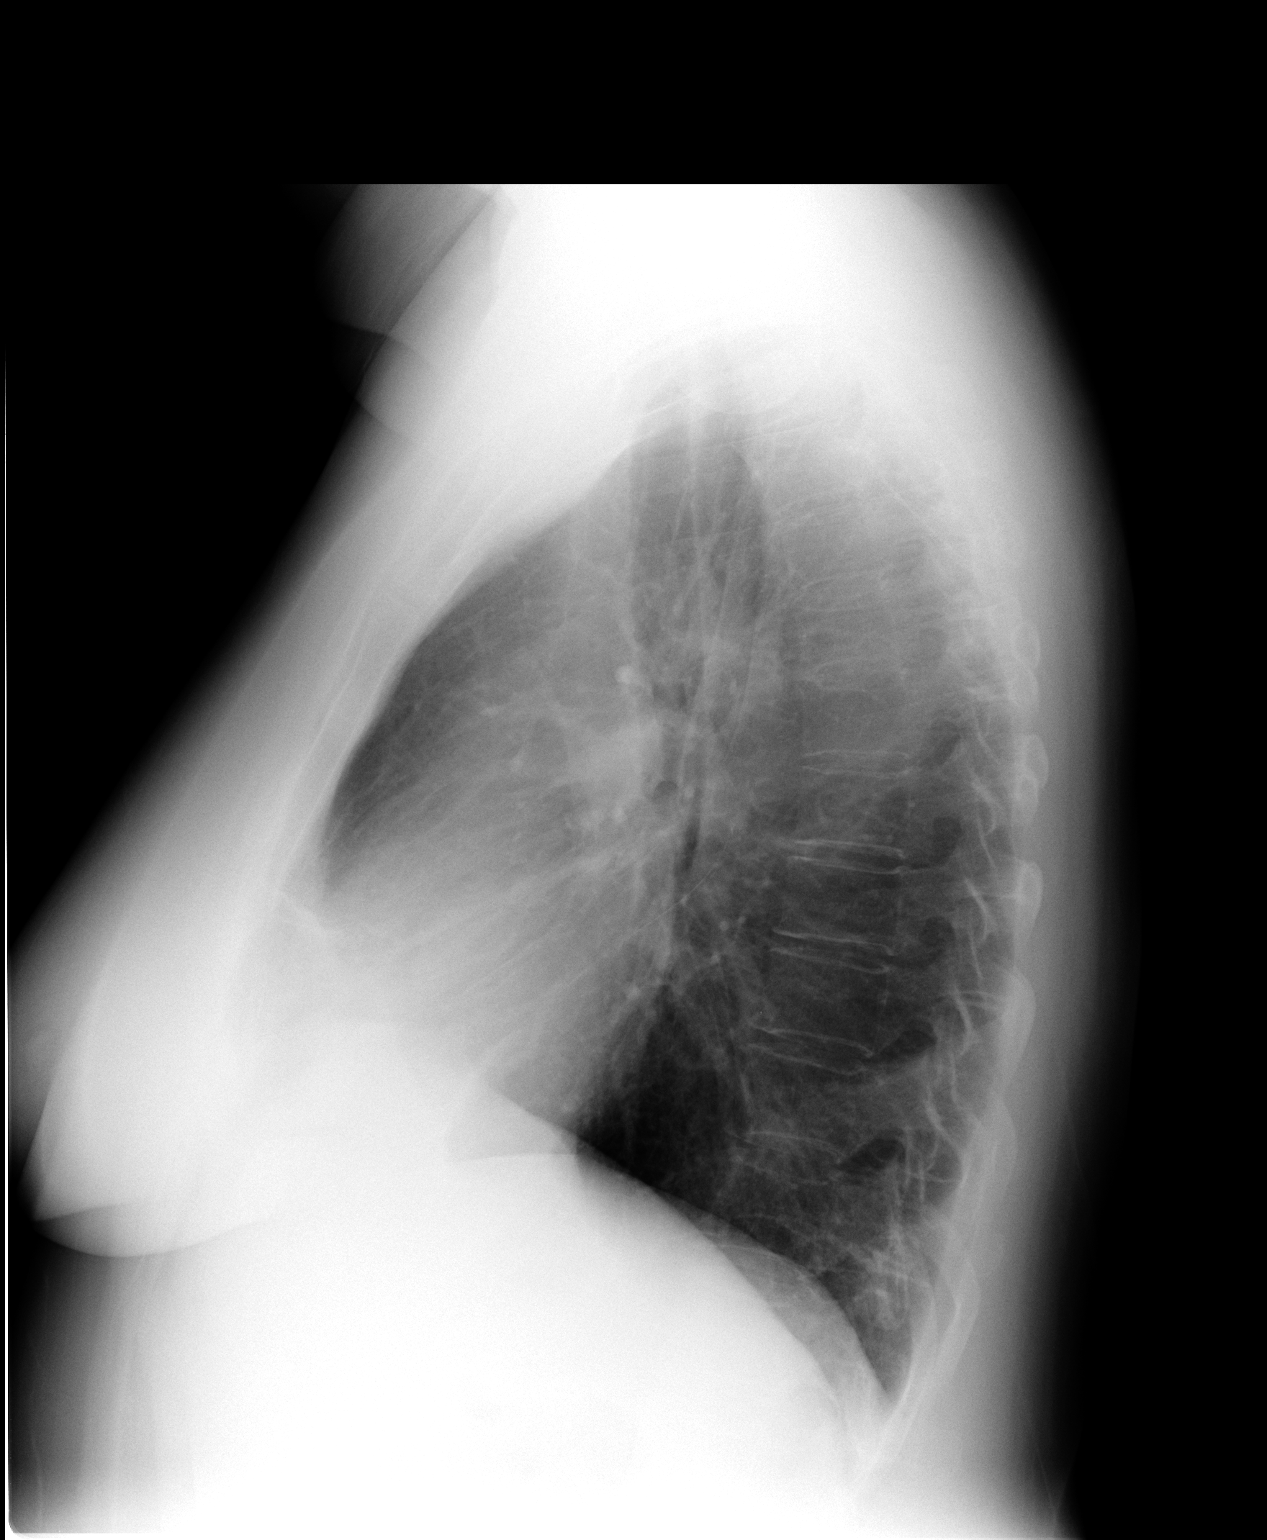

[2 of 2 positions shown; findings below may reference images not displayed]

FINDINGS: Since 10/13/2008, slight linear bibasilar atelectasis or
scarring seen.

Lungs otherwise clear.

Heart size normal.

Mediastinum, hila, pleura and osseous structures stable and
unremarkable.
IMPRESSION: 1.  Since 10/13/2008, slight bibasilar linear atelectasis or
scarring.
2.  No additional acute findings.

## 2012-05-22 ENCOUNTER — Encounter (HOSPITAL_COMMUNITY): Payer: Self-pay | Admitting: Emergency Medicine

## 2012-05-22 ENCOUNTER — Emergency Department (HOSPITAL_COMMUNITY)
Admission: EM | Admit: 2012-05-22 | Discharge: 2012-05-22 | Disposition: A | Discharge status: Home / Self Care | Payer: Medicare Other | Attending: Emergency Medicine | Admitting: Emergency Medicine

## 2012-05-22 ENCOUNTER — Emergency Department (HOSPITAL_COMMUNITY): Payer: Medicare Other

## 2012-05-22 DIAGNOSIS — R630 Anorexia: Secondary | ICD-10-CM | POA: Insufficient documentation

## 2012-05-22 DIAGNOSIS — R110 Nausea: Secondary | ICD-10-CM | POA: Insufficient documentation

## 2012-05-22 DIAGNOSIS — G2581 Restless legs syndrome: Secondary | ICD-10-CM | POA: Insufficient documentation

## 2012-05-22 DIAGNOSIS — I10 Essential (primary) hypertension: Secondary | ICD-10-CM | POA: Insufficient documentation

## 2012-05-22 DIAGNOSIS — Z8739 Personal history of other diseases of the musculoskeletal system and connective tissue: Secondary | ICD-10-CM | POA: Insufficient documentation

## 2012-05-22 DIAGNOSIS — Z8669 Personal history of other diseases of the nervous system and sense organs: Secondary | ICD-10-CM | POA: Insufficient documentation

## 2012-05-22 DIAGNOSIS — R079 Chest pain, unspecified: Secondary | ICD-10-CM

## 2012-05-22 DIAGNOSIS — I2510 Atherosclerotic heart disease of native coronary artery without angina pectoris: Secondary | ICD-10-CM | POA: Insufficient documentation

## 2012-05-22 DIAGNOSIS — Z8679 Personal history of other diseases of the circulatory system: Secondary | ICD-10-CM | POA: Insufficient documentation

## 2012-05-22 DIAGNOSIS — Z8659 Personal history of other mental and behavioral disorders: Secondary | ICD-10-CM | POA: Insufficient documentation

## 2012-05-22 DIAGNOSIS — Z8639 Personal history of other endocrine, nutritional and metabolic disease: Secondary | ICD-10-CM | POA: Insufficient documentation

## 2012-05-22 DIAGNOSIS — Z862 Personal history of diseases of the blood and blood-forming organs and certain disorders involving the immune mechanism: Secondary | ICD-10-CM | POA: Insufficient documentation

## 2012-05-22 DIAGNOSIS — Z79899 Other long term (current) drug therapy: Secondary | ICD-10-CM | POA: Insufficient documentation

## 2012-05-22 DIAGNOSIS — Z8742 Personal history of other diseases of the female genital tract: Secondary | ICD-10-CM | POA: Insufficient documentation

## 2012-05-22 DIAGNOSIS — Z8719 Personal history of other diseases of the digestive system: Secondary | ICD-10-CM | POA: Insufficient documentation

## 2012-05-22 DIAGNOSIS — F17200 Nicotine dependence, unspecified, uncomplicated: Secondary | ICD-10-CM | POA: Insufficient documentation

## 2012-05-22 DIAGNOSIS — R0789 Other chest pain: Secondary | ICD-10-CM | POA: Insufficient documentation

## 2012-05-22 DIAGNOSIS — R1013 Epigastric pain: Secondary | ICD-10-CM | POA: Insufficient documentation

## 2012-05-22 LAB — CBC WITH DIFFERENTIAL/PLATELET
Basophils Absolute: 0 10*3/uL (ref 0.0–0.1)
Basophils Relative: 0 % (ref 0–1)
Eosinophils Relative: 1 % (ref 0–5)
HCT: 38.9 % (ref 36.0–46.0)
Lymphocytes Relative: 27 % (ref 12–46)
MCH: 32.5 pg (ref 26.0–34.0)
MCHC: 35 g/dL (ref 30.0–36.0)
MCV: 93.1 fL (ref 78.0–100.0)
Monocytes Absolute: 0.6 10*3/uL (ref 0.1–1.0)
RDW: 12.9 % (ref 11.5–15.5)

## 2012-05-22 LAB — COMPREHENSIVE METABOLIC PANEL
ALT: 8 U/L (ref 0–35)
AST: 12 U/L (ref 0–37)
CO2: 28 mEq/L (ref 19–32)
Chloride: 102 mEq/L (ref 96–112)
GFR calc non Af Amer: >90 mL/min (ref >90)
Sodium: 140 mEq/L (ref 135–145)
Total Bilirubin: 0.4 mg/dL (ref 0.3–1.2)

## 2012-05-22 MED ORDER — SODIUM CHLORIDE 0.9 % IV SOLN
Freq: Once | INTRAVENOUS | Status: AC
  Administered 2012-05-22: 11:00:00 via INTRAVENOUS

## 2012-05-22 MED ORDER — GI COCKTAIL ~~LOC~~
30.0000 mL | Freq: Once | ORAL | Status: AC
  Administered 2012-05-22: 30 mL via ORAL
  Filled 2012-05-22: qty 30

## 2012-05-22 MED ORDER — SODIUM CHLORIDE 0.9 % IV SOLN: INTRAVENOUS | Status: DC

## 2012-05-22 MED ORDER — GI COCKTAIL ~~LOC~~: 30.0000 mL | Freq: Once | ORAL | Status: DC

## 2012-05-22 NOTE — ED Provider Notes (Signed)
Medical screening examination/treatment/procedure(s) were conducted as a shared visit with non-physician practitioner(s) and myself.  I personally evaluated the patient during the encounter  Constant chest pain that at times worsens.  Pain improved with GI cocktail.  Patient reports that she had normal coronary arteries and 2005 on heart catheterization.  Discharge home with cardiology followup with PCP followup.  Lyanne Co, MD 05/22/12 [phone]

## 2012-05-22 NOTE — ED Provider Notes (Signed)
History     CSN: [id number]  Arrival date & time 05/22/12  1024   First MD Initiated Contact with Patient 05/22/12 1044      Chief Complaint  Patient presents with  . Chest Pain    (Consider location/radiation/quality/duration/timing/severity/associated sxs/prior treatment) HPI Comments: Pt is a known smoker with hx of CAD, HTN, HLP, who presents today via EMS for CP x 3 days.  States he was out to dinner and began experiencing a sharp pain in the left side of her chest that radiating down her left arm.  Was seen by PCP earlier today and was sent here for further evaluation.  Pain has subsided some but is still present localized to the left chest and epigastric region.  Pain currently 3/10 and radiating to her mid upper back, between her shoulder blades.  Was given 2 nitro en route to ED for pain with good relief.  Has noted a decreased appetite and nausea.  Prior cholecystectomy.  Denies any numbness or paresthesias in UE.  Denies any SOB, diarrhea, or dizziness.  Patient is a 42 y.o. female presenting with chest pain. The history is provided by the patient.  Chest Pain Associated symptoms: abdominal pain and nausea     Past Medical History  Diagnosis Date  . Bipolar 1 disorder   . Menopausal state   . Fibromyalgia   . CAD (coronary artery disease)   . Hyperlipidemia   . Hypertension   . Chronic back pain   . GERD (gastroesophageal reflux disease)   . RLS (restless legs syndrome)   . Intervertebral disc protrusion   . Depression     on diability  . Migraine     Past Surgical History  Procedure Laterality Date  . Abdominal hysterectomy      TAH w/oophorectomy for DUB/ovarian cysts.  . Cholecystectomy      Family History  Problem Relation Age of Onset  . Heart attack Father   . Stroke Mother   . Deep vein thrombosis Mother   . Hyperlipidemia Mother   . Lupus      grandmother and cousin  . Alcohol abuse Father          History  Substance Use Topics  . Smoking  status: Current Every Day Smoker -- 0.50 packs/day for 10 years    Types: Cigarettes  . Smokeless tobacco: Never Used  . Alcohol Use: No    OB History   Grav Para Term Preterm Abortions TAB SAB Ect Mult Living                  Review of Systems  Constitutional: Positive for appetite change.  Cardiovascular: Positive for chest pain.  Gastrointestinal: Positive for nausea and abdominal pain.  All other systems reviewed and are negative.    Allergies  Diphenhydramine hcl and Pregabalin  Home Medications   Current Outpatient Rx  Name  Route  Sig  Dispense  Refill  . albuterol (PROVENTIL HFA;VENTOLIN HFA) 108 (90 BASE) MCG/ACT inhaler   Inhalation   Inhale 2 puffs into the lungs every 6 (six) hours as needed for shortness of breath.           BP 120/82  Pulse 62  Temp(Src) 98.4 F (36.9 C) (Oral)  Resp 12  SpO2 98%  Physical Exam  Nursing note and vitals reviewed. Constitutional: She is oriented to person, place, and time. She appears well-developed and well-nourished.  HENT:  Head: Normocephalic and atraumatic.  Mouth/Throat: Oropharynx is clear and  moist.  Eyes: Conjunctivae and EOM are normal. Pupils are equal, round, and reactive to light.  Neck: Normal range of motion. Neck supple.  Cardiovascular: Normal rate, regular rhythm, normal heart sounds and intact distal pulses.   Pulmonary/Chest: Effort normal and breath sounds normal.  Abdominal: Soft. Bowel sounds are normal. There is tenderness (epigastric). There is no guarding, no CVA tenderness, no tenderness at McBurney's point and negative Murphy's sign.  Musculoskeletal:       Thoracic back: She exhibits pain. She exhibits no tenderness, no bony tenderness and no spasm.  Neurological: She is alert and oriented to person, place, and time. She has normal strength.  CN grossly intact  Skin: Skin is warm and dry.  Psychiatric: She has a normal mood and affect.    ED Course  Procedures (including critical  care time)   Date: 05/22/2012  Rate: 66  Rhythm: normal sinus rhythm  QRS Axis: normal  Intervals: normal  ST/T Wave abnormalities: normal  Conduction Disutrbances:none  Narrative Interpretation: normal EKG  Old EKG Reviewed: none available   Date: 05/22/2012  Rate: 57  Rhythm: normal sinus rhythm  QRS Axis: normal  Intervals: normal  ST/T Wave abnormalities: normal  Conduction Disutrbances:none  Narrative Interpretation: normal EKG  Old EKG Reviewed: unchanged     Labs Reviewed  COMPREHENSIVE METABOLIC PANEL - Abnormal; Notable for the following:    Potassium 3.2 (*)    Glucose, Bld 101 (*)    BUN 3 (*)    Creatinine, Ser 0.49 (*)    All other components within normal limits  CBC WITH DIFFERENTIAL - Abnormal; Notable for the following:    WBC 10.8 (*)    All other components within normal limits  LIPASE, BLOOD  TROPONIN I  TROPONIN I   Dg Chest 2 View  05/22/2012  *RADIOLOGY REPORT*  Clinical Data: Chest and left shoulder pain.  CHEST - 2 VIEW  Comparison: None.  Findings:  Cardiopericardial silhouette within normal limits. Mediastinal contours normal. Trachea midline.  No airspace disease or effusion. Monitoring leads are projected over the chest. Cholecystectomy clips are present in the right upper quadrant.  IMPRESSION: No acute cardiopulmonary disease.   Original Report Authenticated By: Andreas Newport, M.D.      1. Chest pain       MDM  2:58 PM  Negative troponin and EKG x2.  VS remain stable and ok for discharge.  Will most likely need new OP cardiac workup (last echo and cath in 2005).  Given contact information for [facility] for a follow-up appointment as soon as possible. Discussed this plan with patient and she agreed. Return to the ED for new or worsening symptoms.   Garlon Hatchet, PA-C 05/22/12 [phone]

## 2012-05-22 NOTE — ED Notes (Signed)
Pt arrived by EMS from [facility]. Pt c/o CP that started Wednesday evening along with nausea and some SOB. Pt stated that she took ASA 324mg  Wednesday and on Thursday afternoon around 1p took 2 Nitro that eased the pain some. Today pt took 1 ASA at home and was given ASA 324mg  and 2 Nitro at the MD office. EMS admin Zofran 4mg  for nausea which was effective. CP is now 3/10 radiating toward left and between shoulder blades.

## 2012-09-22 ENCOUNTER — Encounter: Payer: Self-pay | Admitting: Cardiology

## 2012-09-22 ENCOUNTER — Ambulatory Visit (INDEPENDENT_AMBULATORY_CARE_PROVIDER_SITE_OTHER): Payer: Medicare Other | Admitting: Cardiology

## 2012-09-22 VITALS — BP 130/70 | HR 96 | Ht 66.0 in | Wt 143.3 lb

## 2012-09-22 DIAGNOSIS — I201 Angina pectoris with documented spasm: Secondary | ICD-10-CM

## 2012-09-22 DIAGNOSIS — F17200 Nicotine dependence, unspecified, uncomplicated: Secondary | ICD-10-CM

## 2012-09-22 DIAGNOSIS — F411 Generalized anxiety disorder: Secondary | ICD-10-CM

## 2012-09-22 DIAGNOSIS — R609 Edema, unspecified: Secondary | ICD-10-CM

## 2012-09-22 DIAGNOSIS — Z7189 Other specified counseling: Secondary | ICD-10-CM

## 2012-09-22 DIAGNOSIS — Z716 Tobacco abuse counseling: Secondary | ICD-10-CM

## 2012-09-22 DIAGNOSIS — E785 Hyperlipidemia, unspecified: Secondary | ICD-10-CM

## 2012-09-22 MED ORDER — AMLODIPINE BESYLATE 2.5 MG PO TABS: 2.5000 mg | ORAL_TABLET | Freq: Every day | ORAL | Status: AC

## 2012-09-22 NOTE — Patient Instructions (Addendum)
You seem to be doing better.  Happy to hear of only 1 episode of Chest Pain.  I am going to start you on a low dose of Amlodipine - an anti-spasm Blood Pressure medicine. - take it ~1-2 hours prior to bedtime. Shift your IMDUR (Isosorbide Mononitrate) to AM after breakfast.  Keep taking the Effexor - you are smiling today!!  We will see you back in ~6 months  Adrie Picking W, MD

## 2012-09-23 NOTE — Progress Notes (Signed)
Patient ID: [name], female   DOB: June 20, 1970, 42 y.o.   MRN: [id number]   Clinic Note: HPI: [name] is a 42 y.o. female with a PMH below who presents today for followup of her palpitations come side effects and what sounds like coronary vasospasm related chest discomfort. I first saw her on April 4 of this year as a self-referral after I take care of her son for coronary spasm related MI.  She is as past history of significant anxiety and panic attacks on Effexor and Klonopin.  She also has reactive airways disease probably with some component of COPD, being on Symbicort. Her other medical history includes a cardiac catheterization done in 2005 which showed a 30-50% proximal LAD lesion and was thought to potentially have spasm. She has been on p.r.n. nitroglycerin since and was actually seen in the hospital back in February for an episode of chest pain after being seen by her primary physician. The pain was radiating up into her mid upper back into her shoulder blades. Since has also noted episodic shortness of breath and episodes of chest pain, that are never associated with exertion always occurring in the morning or when she is relaxing in the afternoons. They are always relieved pretty much the nitroglycerin.  When I saw her in April I started her on a long-acting nitrate to see how this would affect her blood pressure, with the thought that I would potentially one of her on a long-acting calcium channel blocker for antispasmodic. I did check a CRP and sedimentation rate along with a lipid panel. Her CRP was mildly elevated, but the sedimentation rate was normal. This may be increased by her fibromyalgia, but it would increase our interest to treat her lipids more aggressively.  Interval History: Since I last saw her she is actually doing much better she has only had one occasion where she actually had to take her nitroglycerin twice since I saw her, but only that one time  which was after a prolonged confrontation with a family number. She still has a little but of mild dizziness which he first stands up in the morning, but is usually better by the time she is going to the day. Otherwise she denies any chest pain or shortness of breath with exertion, no palpitations, syncope or near-syncope symptoms. No TIA or amaurosis fugax symptoms. No melena, hematochezia or hematuria. No claudication symptoms. She continues to smoke. She actually recently has had a bout of bronchitis.   Past Medical History  Diagnosis Date  . Bipolar 1 disorder   . Menopausal state   . Fibromyalgia   . CAD (coronary artery disease)      Myoview 2005 : No ischemia infarction; mild to moderate, nonobstructive CAD by cath  . Hyperlipidemia   . Hypertension   . Chronic back pain   . GERD (gastroesophageal reflux disease)   . RLS (restless legs syndrome)   . Intervertebral disc protrusion   . Depression     on diability  . Migraine     Prior Cardiac Evaluation and Past Surgical History: Past Surgical History  Procedure Laterality Date  . Abdominal hysterectomy      TAH w/oophorectomy for DUB/ovarian cysts.  . Cholecystectomy    . Cardiac catheterization  2005    After continued pain following a negative Myoview: Mild to moderate, nonobstructive CAD  . Doppler echocardiography  2005    ormal LV size and function, EF of 55-65%. No significant valvular lesions  .  Abnormal chest ct  2005    scattered nodular and ground-glass    Allergies  Allergen Reactions  . Diphenhydramine Hcl     REACTION: Hives  . Pregabalin     REACTION: drowsiness    Current Outpatient Prescriptions  Medication Sig Dispense Refill  . albuterol (PROVENTIL HFA;VENTOLIN HFA) 108 (90 BASE) MCG/ACT inhaler Inhale 2 puffs into the lungs every 6 (six) hours as needed for shortness of breath.      . Budesonide-Formoterol Fumarate (SYMBICORT IN) Inhale 1 each into the lungs 2 (two) times daily.      .  clonazePAM (KLONOPIN) 1 MG tablet Take 0.5 mg by mouth 2 (two) times daily.      . fluticasone (FLONASE) 50 MCG/ACT nasal spray Place 1 spray into the nose daily.      . isosorbide mononitrate (IMDUR) 30 MG 24 hr tablet Take 30 mg by mouth daily.      Marland Kitchen NEXIUM 20 MG capsule Take 20 mg by mouth daily.      . niacin (NIASPAN) 500 MG CR tablet Take 1 tablet by mouth daily.      . promethazine (PHENERGAN) 12.5 MG tablet Take 1 tablet by mouth as needed.      . venlafaxine XR (EFFEXOR-XR) 75 MG 24 hr capsule Take 75 mg by mouth 3 (three) times daily.      Marland Kitchen amLODipine (NORVASC) 2.5 MG tablet Take 1 tablet (2.5 mg total) by mouth daily. At night 1-2 hours before bedtime  180 tablet  3   No current facility-administered medications for this visit.    History   Social History  . Marital Status: Married    Spouse Name: N/A    Number of Children: 2  . Years of Education: N/A   Occupational History  . Not on file.   Social History Main Topics  . Smoking status: Current Every Day Smoker -- 0.75 packs/day for 16 years    Types: Cigarettes  . Smokeless tobacco: Never Used  . Alcohol Use: No  . Drug Use: No  . Sexually Active: Yes   Other Topics Concern  . Not on file   Social History Narrative  . No narrative on file    ROS: A comprehensive Review of Systems - Negative except Pertinent positives noted above and mild positive below Respiratory ROS: positive for - cough and And mild wheezing with a recent bout of bronchitis that is now resolved.  Notable for constant lower back pain, intermittent indigestion and diarrhea. Significant anxiety. She also has a history of fibromyalgia, pain and some diffuse muscle pains.  PHYSICAL EXAM BP 130/70  Pulse 96  Ht 5\' 6"  (1.676 m)  Wt 143 lb 4.8 oz (65 kg)  BMI 23.14 kg/m2 General: She is a pleasant and much less anxious appearing woman.  She is well-groomed, well-nourished. She smells of cigarettes but not profusely. She does answer questions  appropriately.  HEENT: She is wearing glasses, otherwise NCAT, EOMI, MMM. Anicteric sclerae.  Neck: Supple, no LAN, JVD or carotid bruit. Heart: RRR, normal S1, S2, no M/R/G. Nondisplaced PMI.  Lungs: CTAB, nonlabored, normal effort, good air movement.  Abdomen: Soft/NT/ND/NABS. No HSM. Extremities: No C/C/E, 2+ and equal pulses throughout.  Neurologic: Cranial nerves II-XII are grossly intact and strength is 5/5 throughout.  She has normal gait and normal strength, 5/5 throughout.  WUJ:[id number] today: No   Recent Labs: CRP 17 which is elevated, a sedimentation rate 10, normal. Total cholesterol 193 contrast is 150, LDL  elevated 135 and HDL 28, low.  ASSESSMENT: Symptomatically improved on nitrate.  I don't want to start her on medication right now with the amlodipine starting, therefore I'll hold off on starting her on additional lipid lowering agent be on Niaspan. I think we'll probably having to start her on a statin.   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 Meds ordered this encounter  Medications  . amLODipine (NORVASC) 2.5 MG tablet    Sig: Take 1 tablet (2.5 mg total) by mouth daily. At night 1-2 hours before bedtime    Dispense:  180 tablet    Refill:  3    Followup: 6  HARDING,DAVID W, M.D., M.S. [facility] [address] [place], [place]  [address]  [phone] Pager # [phone] 09/24/2012 12:32 AM

## 2012-09-24 ENCOUNTER — Encounter: Payer: Self-pay | Admitting: Cardiology

## 2012-09-24 DIAGNOSIS — I201 Angina pectoris with documented spasm: Secondary | ICD-10-CM | POA: Insufficient documentation

## 2012-09-24 NOTE — Assessment & Plan Note (Signed)
Overall she is improved on Imdur. As she is still requiring some additional sublingual nitroglycerin, with her blood pressure now being in the 130s we can likely start low-dose amlodipine 2.5 mg daily. She will need to ensure that she hydrates and eats well to avoid worsening orthostatic symptoms. She will take the amlodipine at night she should be okay good distal been allowed to switch her Imdur to the morning.

## 2012-09-24 NOTE — Assessment & Plan Note (Addendum)
I certainly think that the level of stress that she has is to drive her her variant angina. This will need to be treated aggressively by her primary physician. She seems to be doing relatively well on the Effexor, I did note that she was smiling a lot more today and overall seems less anxious. It seems that taking the edge off. Hopefully, this will also help with smoking cessation.

## 2012-09-24 NOTE — Assessment & Plan Note (Signed)
With the elevated CRP, and her family history of coronary disease, we will need to probably start her on a statin, however I'm reluctant to do so with her just starting on the medication now. We can start that in a month or so after she's been on amlodipine for a while. We can then recheck a set chemistry and cholesterol before see her back in followup.

## 2012-09-24 NOTE — Assessment & Plan Note (Signed)
She does note a little edema but nothing overly concerning to her. We'll use with proper field. We did talk about using support hose l if she is going to be on her feet for a long time. t.

## 2012-09-24 NOTE — Assessment & Plan Note (Addendum)
I spent well over 10 minutes talking to her about the importance of smoking cessation as well as very various plans for doing so including patches and Wellbutrin, or even considering Chantix, however Chantix would not be something I would choose simply because of the signal in the literature of possible increased cardiovascular risk. Maybe she'll derive some benefit from Effexor as well.

## 2012-11-04 ENCOUNTER — Telehealth: Payer: Self-pay | Admitting: *Deleted

## 2012-11-04 NOTE — Telephone Encounter (Signed)
Message copied by Tobin Chad on Wed Nov 04, 2012  5:23 PM ------      Message from: [facility], DAVID      Created: Thu Sep 24, 2012 12:36 AM       I was noticing while I was dictating this patient's note, that she needs to be on statin. A 1 to give her about a month after starting amlodipine CL she's doing but may be we can get her a prescription for Lipitor 40 mg daily in about a month. And we'll schedule recheck of chemistry panel lipid panel prior to her seeing me back in the schedule 6 months.            Marykay Lex, MD       ------

## 2012-11-04 NOTE — Telephone Encounter (Signed)
Left message to call back  rx for lipitor 40 mg need to be sent to pharmacy

## 2013-03-18 ENCOUNTER — Ambulatory Visit: Payer: Self-pay | Admitting: Cardiology

## 2013-04-09 ENCOUNTER — Ambulatory Visit: Payer: Medicare Other | Admitting: Cardiology

## 2013-09-27 ENCOUNTER — Telehealth: Payer: Self-pay | Admitting: Cardiology

## 2013-09-27 NOTE — Telephone Encounter (Signed)
Returned a phone call. No answer/ machine. Will wait for patient to call back.

## 2013-09-27 NOTE — Telephone Encounter (Signed)
Patient states that about 4 days ago she had a sharp Ice Pick type pain on the L. Side of her chest going into her back.  Since then, that area has been tender and sore.  Could this have been her heart?.Marland Kitchen

## 2013-10-05 ENCOUNTER — Encounter: Payer: Self-pay | Admitting: *Deleted

## 2013-10-06 ENCOUNTER — Ambulatory Visit: Payer: Self-pay | Admitting: Cardiology

## 2019-09-12 ENCOUNTER — Emergency Department (INDEPENDENT_AMBULATORY_CARE_PROVIDER_SITE_OTHER): Payer: Medicare Other

## 2019-09-12 ENCOUNTER — Other Ambulatory Visit: Payer: Self-pay

## 2019-09-12 ENCOUNTER — Encounter: Payer: Self-pay | Admitting: Emergency Medicine

## 2019-09-12 ENCOUNTER — Emergency Department
Admission: EM | Admit: 2019-09-12 | Discharge: 2019-09-12 | Disposition: A | Discharge status: Home / Self Care | Payer: Medicare Other | Source: Home / Self Care

## 2019-09-12 DIAGNOSIS — M546 Pain in thoracic spine: Secondary | ICD-10-CM

## 2019-09-12 DIAGNOSIS — M4180 Other forms of scoliosis, site unspecified: Secondary | ICD-10-CM

## 2019-09-12 MED ORDER — IBUPROFEN 600 MG PO TABS
600.0000 mg | ORAL_TABLET | Freq: Four times a day (QID) | ORAL | 0 refills | Status: AC | PRN
Start: 2019-09-12 — End: ?

## 2019-09-12 MED ORDER — HYDROCODONE-ACETAMINOPHEN 5-325 MG PO TABS: 1.0000 | ORAL_TABLET | Freq: Four times a day (QID) | ORAL | 0 refills | Status: DC | PRN

## 2019-09-12 MED ORDER — HYDROCODONE-ACETAMINOPHEN 5-325 MG PO TABS: 1.0000 | ORAL_TABLET | Freq: Four times a day (QID) | ORAL | 0 refills | Status: AC | PRN

## 2019-09-12 MED ORDER — METHYLPREDNISOLONE ACETATE 80 MG/ML IJ SUSP
80.0000 mg | Freq: Once | INTRAMUSCULAR | Status: AC
  Administered 2019-09-12: 80 mg via INTRAMUSCULAR

## 2019-09-12 NOTE — ED Triage Notes (Signed)
Patient c/o that she has a history of DDD and she began working out, unsure if she has lifted weights that may have been too heavy for her.  Started with right shoulder pain now both shoulders are hurting.  Currently on muscle relaxers and taking small doses of Tylenol as well, applying heat as well.

## 2019-09-12 NOTE — ED Provider Notes (Signed)
[name]    CSN: [id number] Arrival date & time: 09/12/19  1219      History   Chief Complaint Chief Complaint  Patient presents with  . Back Pain    HPI [name] is a 49 y.o. female.   HPI [name] is a 49 y.o. female with hx of DDD and chronic back pain presenting to UC with Right sided back pain right between her shoulder blades that started this week after lifting weight while working out. Pt questions if weights were too heavy but denies a specific injury. 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  She has also been taking Robaxin with mild relief.  Denies radiation of pain or numbness in arms or legs. No hx of back surgeries.    Past Medical History:  Diagnosis Date  . Bipolar 1 disorder (HCC)   . CAD (coronary artery disease)     Myoview 2005 : No ischemia infarction; mild to moderate, nonobstructive CAD by cath  . Chronic back pain   . Depression    on diability  . Fibromyalgia   . GERD (gastroesophageal reflux disease)   . Hyperlipidemia   . Hypertension   . Intervertebral disc protrusion   . Menopausal state   . Migraine   . RLS (restless legs syndrome)     Patient Active Problem List   Diagnosis Date Noted  . Variant angina - occurs at rest, relieved with nitroglycerin 09/24/2012  . MDD (major depressive disorder) 04/15/2011  . GAD (generalized anxiety disorder) 04/15/2011  . Chest wall pain 10/19/2010  . RIB PAIN, LEFT SIDED 03/05/2010  . DIZZINESS 12/06/2009  . EDEMA 12/06/2009  . ESSENTIAL HYPERTENSION, BENIGN 06/19/2009  . COMMON MIGRAINE 06/09/2009  . NAUSEA AND VOMITING 06/09/2009  . FIBROMYALGIA 04/24/2009  . CIGARETTE SMOKER 04/03/2009  . OVERACTIVE BLADDER 04/03/2009  . FECAL INCONTINENCE 11/14/2008  . UNSPECIFIED URINARY INCONTINENCE 11/14/2008  . DISC DISEASE, LUMBAR 10/06/2008  . MIXED INCONTINENCE URGE AND STRESS 10/06/2008  . BACK PAIN, LUMBAR  03/24/2007  . HYPERLIPIDEMIA 03/19/2007  . WEAKNESS, LEFT SIDE OF BODY 03/19/2007    Past Surgical History:  Procedure Laterality Date  . ABDOMINAL HYSTERECTOMY     TAH w/oophorectomy for DUB/ovarian cysts.  . Abnormal chest CT  2005   scattered nodular and ground-glass  . CARDIAC CATHETERIZATION  2005   After continued pain following a negative Myoview: Mild to moderate, nonobstructive CAD  . CHOLECYSTECTOMY    . DOPPLER ECHOCARDIOGRAPHY  2005   ormal LV size and function, EF of 55-65%. No significant valvular lesions  . nuclear stress test N/A 12-15-2003   NORMAL EXAM WITH NO EVIDENCE OF STRESS INDUCED ISCHEMIA. THE LV EF IS 69%.    OB History   No obstetric history on file.      Home Medications    Prior to Admission medications   Medication Sig Start Date End Date Taking? Authorizing Provider  albuterol (PROVENTIL HFA;VENTOLIN HFA) 108 (90 BASE) MCG/ACT inhaler Inhale 2 puffs into the lungs every 6 (six) hours as needed for shortness of breath.   Yes [provider]  amLODipine (NORVASC) 2.5 MG tablet Take 1 tablet (2.5 mg total) by mouth daily. At night 1-2 hours before bedtime 09/22/12  Yes Marykay Lex, MD  aspirin 81 MG EC tablet Take by mouth.   Yes [provider]  Budesonide-Formoterol Fumarate (SYMBICORT IN) Inhale 1 each into the lungs 2 (two)  times daily.   Yes [provider]  Cholecalciferol 25 MCG (1000 UT) capsule Take 1 capsule by mouth daily.   Yes [provider]  clonazePAM (KLONOPIN) 1 MG tablet Take 0.5 mg by mouth 2 (two) times daily. 08/27/12  Yes [provider]  dicyclomine (BENTYL) 10 MG capsule Take 1 capsule by mouth 3 (three) times daily. 08/08/17  Yes [provider]  doxepin (SINEQUAN) 25 MG capsule Take 1 capsule by mouth at bedtime. 07/31/17  Yes [provider]  eszopiclone (LUNESTA) 1 MG TABS tablet Take by mouth. 08/12/19  Yes [provider]  fluticasone (FLONASE) 50  MCG/ACT nasal spray Place 1 spray into the nose daily. 09/09/12  Yes [provider]  levocetirizine (XYZAL) 5 MG tablet Take by mouth. 06/04/17  Yes [provider]  LORazepam (ATIVAN) 1 MG tablet Take by mouth. 07/17/17  Yes [provider]  Lurasidone HCl (LATUDA) 60 MG TABS TAKE ONE TABLET IN THE EVENING WITH food 11/04/16  Yes [provider]  methocarbamol (ROBAXIN) 750 MG tablet Take 1 tablet by mouth 4 (four) times daily. 08/08/17  Yes [provider]  nitroGLYCERIN (NITROSTAT) 0.4 MG SL tablet Place under the tongue. 11/27/15  Yes [provider]  omeprazole (PRILOSEC) 40 MG capsule Take by mouth. 06/26/17  Yes [provider]  phentermine 15 MG capsule Take 15 mg by mouth every morning. 08/17/19  Yes [provider]  pravastatin (PRAVACHOL) 20 MG tablet Take one tablet (20 mg dose) by mouth daily. 06/12/17  Yes [provider]  pregabalin (LYRICA) 150 MG capsule Take by mouth. 06/26/17  Yes [provider]  SUMAtriptan (IMITREX) 50 MG tablet Take by mouth. 06/04/17  Yes [provider]  HYDROcodone-acetaminophen (NORCO/VICODIN) 5-325 MG tablet Take 1 tablet by mouth every 6 (six) hours as needed. 09/12/19   Lurene Shadow, PA-C  ibuprofen (ADVIL) 600 MG tablet Take 1 tablet (600 mg total) by mouth every 6 (six) hours as needed. 09/12/19   Lurene Shadow, PA-C  isosorbide mononitrate (IMDUR) 30 MG 24 hr tablet Take 30 mg by mouth daily. 09/21/12   [provider]  NEXIUM 20 MG capsule Take 20 mg by mouth daily. 09/01/12   [provider]  niacin (NIASPAN) 500 MG CR tablet Take 1 tablet by mouth daily. 09/13/12   [provider]  promethazine (PHENERGAN) 12.5 MG tablet Take 1 tablet by mouth as needed. 08/31/12   [provider]  venlafaxine XR (EFFEXOR-XR) 75 MG 24 hr capsule Take 75 mg by mouth 3 (three) times daily. 07/20/12   [provider]    Family  History Family History  Problem Relation Age of Onset  . Stroke Mother   . Deep vein thrombosis Mother   . Hyperlipidemia Mother   . Heart attack Father   . Alcohol abuse Father           . Lupus Other        grandmother and cousin  . Heart attack Son        Spasm related, Prinzmetal angina  . Lupus Cousin        Also her grandmother    Social History Social History   Tobacco Use  . Smoking status: Current Every Day Smoker    Packs/day: 0.75    Years: 16.00    Pack years: 12.00    Types: Cigarettes  . Smokeless tobacco: Never Used  Substance Use Topics  . Alcohol use: No  .  Drug use: No     Allergies   Diphenhydramine hcl and Pregabalin   Review of Systems Review of Systems  Musculoskeletal: Positive for back pain and myalgias. Negative for neck pain and neck stiffness.  Neurological: Negative for weakness and numbness.     Physical Exam Triage Vital Signs ED Triage Vitals  Enc Vitals Group     BP 09/12/19 1237 (!) 142/88     Pulse Rate 09/12/19 1237 80     Resp --      Temp --      Temp src --      SpO2 09/12/19 1237 99 %     Weight --      Height --      Head Circumference --      Peak Flow --      Pain Score 09/12/19 1238 8     Pain Loc --      Pain Edu? --      Excl. in GC? --    No data found.  Updated Vital Signs BP (!) 142/88 (BP Location: Left Arm)   Pulse 80   SpO2 99%   Visual Acuity Right Eye Distance:   Left Eye Distance:   Bilateral Distance:    Right Eye Near:   Left Eye Near:    Bilateral Near:     Physical Exam Vitals and nursing note reviewed.  Constitutional:      Appearance: Normal appearance. She is well-developed.  HENT:     Head: Normocephalic and atraumatic.  Cardiovascular:     Rate and Rhythm: Normal rate.  Pulmonary:     Effort: Pulmonary effort is normal.  Musculoskeletal:        General: Tenderness present. Normal range of motion.     Cervical back: Normal range of motion and neck supple. No spinous  process tenderness or muscular tenderness.       Back:     Comments: Full ROM upper and lower extremities bilaterally with 5/5 strength   Skin:    General: Skin is warm and dry.  Neurological:     Mental Status: She is alert and oriented to person, place, and time.  Psychiatric:        Behavior: Behavior normal.      UC Treatments / Results  Labs (all labs ordered are listed, but only abnormal results are displayed) Labs Reviewed - No data to display  EKG   Radiology DG Thoracic Spine W/Swimmers  Result Date: 09/12/2019 CLINICAL DATA:  Upper back pain between the scapula EXAM: THORACIC SPINE - 3 VIEWS COMPARISON:  05/22/2012 FINDINGS: Thoracic dextroscoliosis noted, slightly worse measuring approximately 15 degrees. Preserved vertebral body heights. No acute compression fracture, wedge-shaped deformity or focal kyphosis. No significant degenerative process. Normal paraspinal soft tissues. Visualized lungs are clear. Heart size and vascularity. Remote cholecystectomy. IMPRESSION: Thoracic dextroscoliosis measuring approximately 15 degrees, progressive compared to 2014. No acute osseous finding. Electronically Signed   By: Judie Petit.  Shick M.D.   On: 09/12/2019 13:14    Procedures Procedures (including critical care time)  Medications Ordered in UC Medications  methylPREDNISolone acetate (DEPO-MEDROL) injection 80 mg (80 mg Intramuscular Given 09/12/19 1327)    Initial Impression / Assessment and Plan / UC Course  I have reviewed the triage vital signs and the nursing notes.  Pertinent labs & imaging results that were available during my care of the patient were reviewed by me and considered in my medical decision making (see chart for details).  Hx and exam c/w muscle strain Will tx with small course of norco Encouraged f/u with Sports Medicine given hx of chronic back pain AVS given   Final Clinical Impressions(s) / UC Diagnoses   Final diagnoses:  Thoracic back pain   Dextroscoliosis     Discharge Instructions      Norco/Vicodin (hydrocodone-acetaminophen) is a narcotic pain medication, do not combine these medications with others containing tylenol. While taking, do not drink alcohol, drive, or perform any other activities that requires focus while taking these medications.   Call tomorrow to schedule a follow up appointment with Sports Medicine for further evaluation and treatment of your back pain.  A referral for physical therapy has also been placed to help with your back pain. Someone should call you later this week to set up your first appointment.     ED Prescriptions    Medication Sig Dispense Auth. Provider      Noe Gens, PA-C   ibuprofen (ADVIL) 600 MG tablet Take 1 tablet (600 mg total) by mouth every 6 (six) hours as needed. 15 tablet Noe Gens, [place]   HYDROcodone-acetaminophen (NORCO/VICODIN) 5-325 MG tablet Take 1 tablet by mouth every 6 (six) hours as needed. 6 tablet Noe Gens, [place]     I have reviewed the PDMP during this encounter.   Noe Gens, [place] 09/13/19 [phone]

## 2019-09-12 NOTE — Discharge Instructions (Addendum)
  Norco/Vicodin (hydrocodone-acetaminophen) is a narcotic pain medication, do not combine these medications with others containing tylenol. While taking, do not drink alcohol, drive, or perform any other activities that requires focus while taking these medications.   Call tomorrow to schedule a follow up appointment with Sports Medicine for further evaluation and treatment of your back pain.  A referral for physical therapy has also been placed to help with your back pain. Someone should call you later this week to set up your first appointment.

## 2019-09-20 ENCOUNTER — Ambulatory Visit: Payer: Medicare Other | Admitting: Rehabilitative and Restorative Service Providers"

## 2020-04-30 ENCOUNTER — Other Ambulatory Visit: Payer: Self-pay

## 2020-04-30 ENCOUNTER — Emergency Department (INDEPENDENT_AMBULATORY_CARE_PROVIDER_SITE_OTHER): Payer: Medicare Other

## 2020-04-30 ENCOUNTER — Emergency Department
Admission: EM | Admit: 2020-04-30 | Discharge: 2020-04-30 | Disposition: A | Discharge status: Home / Self Care | Payer: Medicare Other | Source: Home / Self Care | Attending: Family Medicine | Admitting: Family Medicine

## 2020-04-30 DIAGNOSIS — W19XXXA Unspecified fall, initial encounter: Secondary | ICD-10-CM

## 2020-04-30 DIAGNOSIS — S8254XA Nondisplaced fracture of medial malleolus of right tibia, initial encounter for closed fracture: Secondary | ICD-10-CM

## 2020-04-30 DIAGNOSIS — S92124A Nondisplaced fracture of body of right talus, initial encounter for closed fracture: Secondary | ICD-10-CM | POA: Diagnosis not present

## 2020-04-30 DIAGNOSIS — M25471 Effusion, right ankle: Secondary | ICD-10-CM | POA: Diagnosis not present

## 2020-04-30 DIAGNOSIS — S92101A Unspecified fracture of right talus, initial encounter for closed fracture: Secondary | ICD-10-CM | POA: Diagnosis not present

## 2020-04-30 NOTE — ED Provider Notes (Signed)
[facility]    CSN: [id number] Arrival date & time: 04/30/20  1120      History   Chief Complaint Chief Complaint  Patient presents with  . Ankle Pain  . Loss of Consciousness    Syncopal episode x 3 yesterday  . Fall    HPI [name] is a 50 y.o. female.   HPI Patient is here for right ankle pain. She states that she fell yesterday and injured her ankle. She cannot comfortably bear weight. It is swollen. She has had a history of fractures in the past. Patient states that she fell 3 times yesterday. She states all 3 time she fainted. She denies lightheadedness, orthostatic symptoms, chest pain, shortness of breath. Her husband has been home with her. He has been able to help her up. She called and got an appointment with her primary care doctor. She refuses to go to the emergency room today for work-up of her syncope. Had a discussion with the patient that syncope is potentially dangerous, and that she could be having an event that would bother her at home before her appointment tomorrow. She has multiple medical problems including hypertension hyperlipidemia and heart disease as well as mental health issues. She has polypharmacy.   Past Medical History:  Diagnosis Date  . Bipolar 1 disorder (HCC)   . CAD (coronary artery disease)     Myoview 2005 : No ischemia infarction; mild to moderate, nonobstructive CAD by cath  . Chronic back pain   . Depression    on diability  . Fibromyalgia   . GERD (gastroesophageal reflux disease)   . Hyperlipidemia   . Hypertension   . Intervertebral disc protrusion   . Menopausal state   . Migraine   . RLS (restless legs syndrome)     Patient Active Problem List   Diagnosis Date Noted  . Variant angina - occurs at rest, relieved with nitroglycerin 09/24/2012  . MDD (major depressive disorder) 04/15/2011  . GAD (generalized anxiety disorder) 04/15/2011  . Chest wall pain 10/19/2010  . RIB PAIN, LEFT SIDED 03/05/2010   . DIZZINESS 12/06/2009  . EDEMA 12/06/2009  . ESSENTIAL HYPERTENSION, BENIGN 06/19/2009  . COMMON MIGRAINE 06/09/2009  . NAUSEA AND VOMITING 06/09/2009  . FIBROMYALGIA 04/24/2009  . CIGARETTE SMOKER 04/03/2009  . OVERACTIVE BLADDER 04/03/2009  . FECAL INCONTINENCE 11/14/2008  . UNSPECIFIED URINARY INCONTINENCE 11/14/2008  . DISC DISEASE, LUMBAR 10/06/2008  . MIXED INCONTINENCE URGE AND STRESS 10/06/2008  . BACK PAIN, LUMBAR 03/24/2007  . HYPERLIPIDEMIA 03/19/2007  . WEAKNESS, LEFT SIDE OF BODY 03/19/2007    Past Surgical History:  Procedure Laterality Date  . ABDOMINAL HYSTERECTOMY     TAH w/oophorectomy for DUB/ovarian cysts.  . Abnormal chest CT  2005   scattered nodular and ground-glass  . CARDIAC CATHETERIZATION  2005   After continued pain following a negative Myoview: Mild to moderate, nonobstructive CAD  . CHOLECYSTECTOMY    . DOPPLER ECHOCARDIOGRAPHY  2005   ormal LV size and function, EF of 55-65%. No significant valvular lesions  . nuclear stress test N/A 12-15-2003   NORMAL EXAM WITH NO EVIDENCE OF STRESS INDUCED ISCHEMIA. THE LV EF IS 69%.    OB History   No obstetric history on file.      Home Medications    Prior to Admission medications   Medication Sig Start Date End Date Taking? Authorizing Provider  albuterol (PROVENTIL HFA;VENTOLIN HFA) 108 (90 BASE) MCG/ACT inhaler Inhale 2 puffs into the lungs every  6 (six) hours as needed for shortness of breath.    [provider]  amLODipine (NORVASC) 2.5 MG tablet Take 1 tablet (2.5 mg total) by mouth daily. At night 1-2 hours before bedtime 09/22/12   Marykay Lex, MD  aspirin 81 MG EC tablet Take by mouth.    [provider]  Budesonide-Formoterol Fumarate (SYMBICORT IN) Inhale 1 each into the lungs 2 (two) times daily.    [provider]  Cholecalciferol 25 MCG (1000 UT) capsule Take 1 capsule by mouth daily.    [provider]  clonazePAM (KLONOPIN) 1 MG tablet Take  0.5 mg by mouth 2 (two) times daily. 08/27/12   [provider]  dicyclomine (BENTYL) 10 MG capsule Take 1 capsule by mouth 3 (three) times daily. 08/08/17   [provider]  doxepin (SINEQUAN) 25 MG capsule Take 1 capsule by mouth at bedtime. 07/31/17   [provider]  eszopiclone (LUNESTA) 1 MG TABS tablet Take by mouth. 08/12/19   [provider]  fluticasone (FLONASE) 50 MCG/ACT nasal spray Place 1 spray into the nose daily. 09/09/12   [provider]  HYDROcodone-acetaminophen (NORCO/VICODIN) 5-325 MG tablet Take 1 tablet by mouth every 6 (six) hours as needed. 09/12/19   Lurene Shadow, PA-C  ibuprofen (ADVIL) 600 MG tablet Take 1 tablet (600 mg total) by mouth every 6 (six) hours as needed. 09/12/19   Lurene Shadow, PA-C  isosorbide mononitrate (IMDUR) 30 MG 24 hr tablet 30 mg daily as needed. For migraines 09/21/12   [provider]  levocetirizine (XYZAL) 5 MG tablet Take by mouth. 06/04/17   [provider]  LORazepam (ATIVAN) 1 MG tablet Take by mouth. 07/17/17   [provider]  Lurasidone HCl (LATUDA) 60 MG TABS TAKE ONE TABLET IN THE EVENING WITH food 11/04/16   [provider]  methocarbamol (ROBAXIN) 750 MG tablet Take 1 tablet by mouth 4 (four) times daily. 08/08/17   [provider]  NEXIUM 20 MG capsule Take 20 mg by mouth daily. 09/01/12   [provider]  niacin (NIASPAN) 500 MG CR tablet Take 1 tablet by mouth daily. 09/13/12   [provider]  nitroGLYCERIN (NITROSTAT) 0.4 MG SL tablet Place under the tongue. 11/27/15   [provider]  omeprazole (PRILOSEC) 40 MG capsule Take by mouth. 06/26/17   [provider]  phentermine 15 MG capsule Take 15 mg by mouth every morning. 08/17/19   [provider]  pravastatin (PRAVACHOL) 20 MG tablet Take one tablet (20 mg dose) by mouth daily. 06/12/17   [provider]  pregabalin (LYRICA) 150 MG capsule Take by  mouth. 06/26/17   [provider]  promethazine (PHENERGAN) 12.5 MG tablet Take 1 tablet by mouth as needed. 08/31/12   [provider]  SUMAtriptan (IMITREX) 50 MG tablet Take by mouth. 06/04/17   [provider]  venlafaxine XR (EFFEXOR-XR) 75 MG 24 hr capsule Take 75 mg by mouth 3 (three) times daily. 07/20/12   [provider]    Family History Family History  Problem Relation Age of Onset  . Stroke Mother   . Deep vein thrombosis Mother   . Hyperlipidemia Mother   . Heart attack Father   . Alcohol abuse Father           . Lupus Other        grandmother and cousin  . Heart attack Son        Spasm related,  Prinzmetal angina  . Lupus Cousin        Also her grandmother    Social History Social History   Tobacco Use  . Smoking status: Former Smoker    Packs/day: 0.75    Years: 16.00    Pack years: 12.00    Types: Cigarettes    Quit date: 04/30/2017    Years since quitting: 3.0  . Smokeless tobacco: Never Used  Vaping Use  . Vaping Use: Some days  Substance Use Topics  . Alcohol use: No  . Drug use: No     Allergies   Diphenhydramine hcl   Review of Systems Review of Systems See HPI  Physical Exam Triage Vital Signs ED Triage Vitals  Enc Vitals Group     BP 04/30/20 1227 (!) 153/114     Pulse Rate 04/30/20 1227 (!) 103     Resp 04/30/20 1227 18     Temp 04/30/20 1227 98.9 F (37.2 C)     Temp Source 04/30/20 1227 Oral     SpO2 04/30/20 1227 99 %     Weight --      Height --      Head Circumference --      Peak Flow --      Pain Score 04/30/20 1230 10     Pain Loc --      Pain Edu? --      Excl. in GC? --    No data found.  Updated Vital Signs BP (!) 149/97 (BP Location: Right Arm)   Pulse (!) 103   Temp 98.9 F (37.2 C) (Oral)   Resp 18   SpO2 99%      Physical Exam Constitutional:      General: She is not in acute distress.    Appearance: She is well-developed and well-nourished.     Comments: Patient  appears thin. In wheelchair. Alert and conversant. Appears to understand the emphasis I am placing on emergency evaluation for her recurring syncope  HENT:     Head: Normocephalic and atraumatic.     Mouth/Throat:     Mouth: Oropharynx is clear and moist.     Comments: Mask is in place Eyes:     Conjunctiva/sclera: Conjunctivae normal.     Pupils: Pupils are equal, round, and reactive to light.  Cardiovascular:     Rate and Rhythm: Tachycardia present.  Pulmonary:     Effort: Pulmonary effort is normal. No respiratory distress.     Breath sounds: Normal breath sounds.  Abdominal:     General: There is no distension.     Palpations: Abdomen is soft.  Musculoskeletal:        General: No edema. Normal range of motion.     Cervical back: Normal range of motion.     Comments: The right ankle has soft tissue swelling diffusely. Tenderness over the medial malleolus. Mild tenderness over the lateral malleolus and lateral ankle. Very limited range of motion.  Skin:    General: Skin is warm and dry.  Neurological:     Mental Status: She is alert.  Psychiatric:        Behavior: Behavior normal.      UC Treatments / Results  Labs (all labs ordered are listed, but only abnormal results are displayed) Labs Reviewed - No data to display  EKG   Radiology DG Ankle Complete Right  Result Date: 04/30/2020 CLINICAL DATA:  Fall 1 day ago with pain and swelling around the ankle. EXAM:  RIGHT ANKLE - COMPLETE 3+ VIEW COMPARISON:  None. FINDINGS: There is an acute fracture of the lateral process of the talus. There is a questioned nondisplaced fracture of the medial malleolus. There is no joint dislocation. A moderate joint effusion is noted. There is surrounding soft tissue swelling. IMPRESSION: 1. Acute fracture of the lateral process of the talus. 2. Possible nondisplaced fracture of the medial malleolus. 3. Moderate joint effusion. Electronically Signed   By: Romona Curls M.D.   On: 04/30/2020  13:22    Procedures Procedures (including critical care time)  Medications Ordered in UC Medications - No data to display  Initial Impression / Assessment and Plan / UC Course  I have reviewed the triage vital signs and the nursing notes.  Pertinent labs & imaging results that were available during my care of the patient were reviewed by me and considered in my medical decision making (see chart for details).     To my review the patient does have a fracture of her medial malleolus as well as her lateral talus. She is placed into a fracture boot. Is told to remain nonweightbearing. She does have an orthopedic office that she goes to in [place]. Will call tomorrow. I explained to the patient that it was urgent that she go to the emergency room promptly, call [phone] if she had any additional fainting spells or change in mental status Final Clinical Impressions(s) / UC Diagnoses   Final diagnoses:  Closed nondisplaced fracture of right talus, unspecified portion of talus, initial encounter  Nondisplaced fracture of medial malleolus of right tibia, initial encounter for closed fracture  Fall, initial encounter     Discharge Instructions     Do not put weight on the ankle/foot Elevate for swelling and pain Call your orthopedic in the morning for a follow up appointment    ED Prescriptions    None     PDMP not reviewed this encounter.   Eustace Moore, MD 04/30/20 [phone]

## 2020-04-30 NOTE — ED Notes (Signed)
Pt a/o and w/o distress. Resp reg and unlabored. Only endorses R ankle pain. Encouraged pt, as per Dr. Delton See, to f/u at ED to do an extensive workup d/t syncopal episodes yesterday. She and her husband verbalize understanding.

## 2020-04-30 NOTE — ED Notes (Signed)
Pt sent to x-ray w/ Gerilyn Pilgrim, RT. She is a/o and w/o complaint. Resp reg and unlabored.

## 2020-04-30 NOTE — Discharge Instructions (Signed)
Do not put weight on the ankle/foot Elevate for swelling and pain Call your orthopedic in the morning for a follow up appointment

## 2020-04-30 NOTE — ED Notes (Signed)
Dr. Delton See made aware of pt's s/s and recent hx and of pt's VS. She states it is okay for pt to remain in UC and get an x-ray of R LE.

## 2020-04-30 NOTE — ED Triage Notes (Signed)
Pt presents to Urgent Care with c/o R ankle and shin pain following fall yesterday secondary to fainting. Pt reports she remembers having "tunnel vision" prior to passing out and states recalling that her leg felt misaligned as she fell. Remembers nothing else about injury, but states R ankle immediately began to swell and she cannot bear weight on it. Pt reports fainting 3 times yesterday. No past hx of syncope. Denies cp and sob. Reports dull headache. Grips are equal, smile symmetrical, and tongue is midline.

## 2024-02-29 ENCOUNTER — Other Ambulatory Visit: Payer: Self-pay

## 2024-02-29 ENCOUNTER — Emergency Department (HOSPITAL_COMMUNITY)

## 2024-02-29 ENCOUNTER — Encounter (HOSPITAL_COMMUNITY): Payer: Self-pay

## 2024-02-29 ENCOUNTER — Emergency Department (HOSPITAL_COMMUNITY)
Admission: EM | Admit: 2024-02-29 | Discharge: 2024-02-29 | Disposition: A | Discharge status: Home / Self Care | Attending: Emergency Medicine | Admitting: Emergency Medicine

## 2024-02-29 DIAGNOSIS — R079 Chest pain, unspecified: Secondary | ICD-10-CM

## 2024-02-29 DIAGNOSIS — R072 Precordial pain: Secondary | ICD-10-CM | POA: Diagnosis present

## 2024-02-29 DIAGNOSIS — R110 Nausea: Secondary | ICD-10-CM | POA: Insufficient documentation

## 2024-02-29 DIAGNOSIS — R0602 Shortness of breath: Secondary | ICD-10-CM | POA: Insufficient documentation

## 2024-02-29 DIAGNOSIS — Z79899 Other long term (current) drug therapy: Secondary | ICD-10-CM | POA: Diagnosis not present

## 2024-02-29 DIAGNOSIS — I10 Essential (primary) hypertension: Secondary | ICD-10-CM | POA: Diagnosis not present

## 2024-02-29 DIAGNOSIS — E876 Hypokalemia: Secondary | ICD-10-CM | POA: Insufficient documentation

## 2024-02-29 DIAGNOSIS — Z7982 Long term (current) use of aspirin: Secondary | ICD-10-CM | POA: Diagnosis not present

## 2024-02-29 LAB — BASIC METABOLIC PANEL WITH GFR
Anion gap: 9 (ref 5–15)
BUN: <5 mg/dL — ABNORMAL LOW (ref 6–20)
CO2: 16 mmol/L — ABNORMAL LOW (ref 22–32)
Calcium: 6.5 mg/dL — ABNORMAL LOW (ref 8.9–10.3)
Chloride: 111 mmol/L (ref 98–111)
Creatinine, Ser: 0.37 mg/dL — ABNORMAL LOW (ref 0.44–1.00)
GFR, Estimated: >60 mL/min (ref >60)
Glucose, Bld: 84 mg/dL (ref 70–99)
Potassium: 2.8 mmol/L — ABNORMAL LOW (ref 3.5–5.1)
Sodium: 136 mmol/L (ref 135–145)

## 2024-02-29 LAB — CBC
HCT: 33.2 % — ABNORMAL LOW (ref 36.0–46.0)
Hemoglobin: 10.5 g/dL — ABNORMAL LOW (ref 12.0–15.0)
MCH: 33 pg (ref 26.0–34.0)
MCHC: 31.6 g/dL (ref 30.0–36.0)
MCV: 104.4 fL — ABNORMAL HIGH (ref 80.0–100.0)
Platelets: 234 K/uL (ref 150–400)
RBC: 3.18 MIL/uL — ABNORMAL LOW (ref 3.87–5.11)
RDW: 11.9 % (ref 11.5–15.5)
WBC: 7.3 K/uL (ref 4.0–10.5)
nRBC: 0 % (ref 0.0–0.2)

## 2024-02-29 LAB — TROPONIN I (HIGH SENSITIVITY)
Troponin I (High Sensitivity): 3 ng/L (ref <18)
Troponin I (High Sensitivity): 4 ng/L (ref <18)

## 2024-02-29 LAB — MAGNESIUM: Magnesium: 1.8 mg/dL (ref 1.7–2.4)

## 2024-02-29 MED ORDER — ONDANSETRON HCL 4 MG/2ML IJ SOLN
4.0000 mg | Freq: Once | INTRAMUSCULAR | Status: AC
  Administered 2024-02-29: 4 mg via INTRAVENOUS
  Filled 2024-02-29: qty 2

## 2024-02-29 MED ORDER — MORPHINE SULFATE (PF) 4 MG/ML IV SOLN
4.0000 mg | Freq: Once | INTRAVENOUS | Status: AC
  Administered 2024-02-29: 4 mg via INTRAVENOUS
  Filled 2024-02-29: qty 1

## 2024-02-29 MED ORDER — CALCIUM GLUCONATE-NACL 1-0.675 GM/50ML-% IV SOLN
1.0000 g | Freq: Once | INTRAVENOUS | Status: AC
  Administered 2024-02-29: 1000 mg via INTRAVENOUS
  Filled 2024-02-29: qty 50

## 2024-02-29 MED ORDER — POTASSIUM CHLORIDE 10 MEQ/100ML IV SOLN
10.0000 meq | INTRAVENOUS | Status: AC
  Administered 2024-02-29 (×2): 10 meq via INTRAVENOUS
  Filled 2024-02-29 (×2): qty 100

## 2024-02-29 NOTE — ED Triage Notes (Signed)
 Pt given 4mg  of zofran by ems

## 2024-02-29 NOTE — ED Provider Notes (Signed)
 [place] EMERGENCY DEPARTMENT AT [facility] Provider Note   CSN: [id number] Arrival date & time: 02/29/24  1700     Patient presents with: Chest Pain   [name] is a 53 y.o. female.   53 year old female with a past medical history of NSTEMI, emphysema, Polar, fibromyalgia, hypertension presents to the ED with a chief complaint of stabbing, crushing substernal chest pain radiating to her left chest which began this morning at rest.  She reports the pain is exacerbated with any type of movement.  She has taken some nitroglycerin x 2 with mild improvement.  She was previously evaluated by cardiology and diagnosed with a small blockage , but reports she did have a normal stress test earlier this year.  The pain began waxing and waning earlier, however now has become constant sharp stabbing.  No exacerbating factors.  She also reported some shortness of breath and was put on oxygen by nursing staff 2 L, however this was discontinued by me as patient satting at 98%.  She also reports feeling some nasal congestion.  Patient is also endorsing some nausea but no vomiting.  He states her blood pressure has been extremely high throughout the entire day. No fever, no leg swelling or other complaints.   The history is provided by the patient.  Chest Pain Pain location:  Substernal area and L chest Pain quality: sharp   Pain radiates to:  Does not radiate Pain severity:  Moderate Onset quality:  Sudden Duration:  8 hours Timing:  Constant Progression:  Worsening Chronicity:  Recurrent Relieved by:  Nothing Worsened by:  Nothing Associated symptoms: nausea and shortness of breath   Associated symptoms: no abdominal pain, no back pain, no fever and no vomiting        Prior to Admission medications   Medication Sig Start Date End Date Taking? Authorizing Provider  albuterol (PROVENTIL HFA;VENTOLIN HFA) 108 (90 BASE) MCG/ACT inhaler Inhale 2 puffs into the lungs every 6  (six) hours as needed for shortness of breath.    [provider]  amLODipine  (NORVASC ) 2.5 MG tablet Take 1 tablet (2.5 mg total) by mouth daily. At night 1-2 hours before bedtime 09/22/12   Anner Alm ORN, MD  aspirin 81 MG EC tablet Take by mouth.    [provider]  Budesonide-Formoterol Fumarate (SYMBICORT IN) Inhale 1 each into the lungs 2 (two) times daily.    [provider]  Cholecalciferol 25 MCG (1000 UT) capsule Take 1 capsule by mouth daily.    [provider]  clonazePAM (KLONOPIN) 1 MG tablet Take 0.5 mg by mouth 2 (two) times daily. 08/27/12   [provider]  dicyclomine (BENTYL) 10 MG capsule Take 1 capsule by mouth 3 (three) times daily. 08/08/17   [provider]  doxepin (SINEQUAN) 25 MG capsule Take 1 capsule by mouth at bedtime. 07/31/17   [provider]  eszopiclone (LUNESTA) 1 MG TABS tablet Take by mouth. 08/12/19   [provider]  fluticasone (FLONASE) 50 MCG/ACT nasal spray Place 1 spray into the nose daily. 09/09/12   [provider]  HYDROcodone -acetaminophen  (NORCO/VICODIN) 5-325 MG tablet Take 1 tablet by mouth every 6 (six) hours as needed. 09/12/19   Anitra Rocky KIDD, PA-C  ibuprofen  (ADVIL ) 600 MG tablet Take 1 tablet (600 mg total) by mouth every 6 (six) hours as needed. 09/12/19   Anitra Rocky KIDD, PA-C  isosorbide mononitrate (IMDUR) 30 MG 24 hr tablet 30 mg daily as needed.  For migraines 09/21/12   [provider]  levocetirizine (XYZAL) 5 MG tablet Take by mouth. 06/04/17   [provider]  LORazepam (ATIVAN) 1 MG tablet Take by mouth. 07/17/17   [provider]  Lurasidone HCl (LATUDA) 60 MG TABS TAKE ONE TABLET IN THE EVENING WITH food 11/04/16   [provider]  methocarbamol (ROBAXIN) 750 MG tablet Take 1 tablet by mouth 4 (four) times daily. 08/08/17   [provider]  NEXIUM 20 MG capsule Take 20 mg by mouth daily. 09/01/12   [provider]  niacin (NIASPAN) 500 MG CR tablet Take 1 tablet by mouth daily. 09/13/12   [provider]  nitroGLYCERIN (NITROSTAT) 0.4 MG SL tablet Place under the tongue. 11/27/15   [provider]  omeprazole (PRILOSEC) 40 MG capsule Take by mouth. 06/26/17   [provider]  phentermine 15 MG capsule Take 15 mg by mouth every morning. 08/17/19   [provider]  pravastatin (PRAVACHOL) 20 MG tablet Take one tablet (20 mg dose) by mouth daily. 06/12/17   [provider]  pregabalin (LYRICA) 150 MG capsule Take by mouth. 06/26/17   [provider]  promethazine (PHENERGAN) 12.5 MG tablet Take 1 tablet by mouth as needed. 08/31/12   [provider]  SUMAtriptan (IMITREX) 50 MG tablet Take by mouth. 06/04/17   [provider]  venlafaxine  XR (EFFEXOR -XR) 75 MG 24 hr capsule Take 75 mg by mouth 3 (three) times daily. 07/20/12   [provider]    Allergies: Diphenhydramine hcl    Review of Systems  Constitutional:  Negative for chills and fever.  HENT:  Negative for sinus pressure.   Respiratory:  Positive for shortness of breath.   Cardiovascular:  Positive for chest pain.  Gastrointestinal:  Positive for nausea. Negative for abdominal pain and vomiting.  Genitourinary:  Negative for flank pain.  Musculoskeletal:  Negative for back pain.  All other systems reviewed and are negative.   Updated Vital Signs BP (!) 142/86   Pulse (!) 48   Temp 98.4 F (36.9 C) (Oral)   Resp 18   Ht 5' 6 (1.676 m)   Wt 57.6 kg   SpO2 97%   BMI 20.50 kg/m   Physical Exam  (all labs ordered are listed, but only abnormal results are displayed) Labs Reviewed  BASIC METABOLIC PANEL WITH GFR - Abnormal; Notable for the following components:      Result Value   Potassium 2.8 (*)    CO2 16 (*)    BUN <5 (*)    Creatinine, Ser 0.37 (*)    Calcium 6.5 (*)    All other components within normal limits  CBC - Abnormal; Notable for the  following components:   RBC 3.18 (*)    Hemoglobin 10.5 (*)    HCT 33.2 (*)    MCV 104.4 (*)    All other components within normal limits  MAGNESIUM  TROPONIN I (HIGH SENSITIVITY)  TROPONIN I (HIGH SENSITIVITY)    EKG: EKG Interpretation Date/Time:  Sunday February 29 2024 17:28:36 EST Ventricular Rate:  56 PR Interval:  144 QRS Duration:  94 QT Interval:  415 QTC Calculation: 401 R Axis:   48  Text Interpretation: Sinus rhythm Confirmed by Cottie Cough [phone]) on 02/29/2024 5:33:48 PM  Radiology: [facility] Chest Port 1 View Result Date: 02/29/2024 CLINICAL DATA:  Chest pain upon awakening this morning. EXAM: PORTABLE CHEST 1 VIEW COMPARISON:  05/22/2012. FINDINGS: Cardiac silhouette is normal  in size and configuration. No mediastinal or hilar masses. Lungs are clear.  No pleural effusion or pneumothorax. Skeletal structures are grossly intact. IMPRESSION: No active disease. Electronically Signed   By: Alm Parkins M.D.   On: 02/29/2024 18:53     Procedures   Medications Ordered in the ED  morphine  (PF) 4 MG/ML injection 4 mg (4 mg Intravenous Given 02/29/24 1748)  ondansetron  (ZOFRAN ) injection 4 mg (4 mg Intravenous Given 02/29/24 1747)  potassium chloride  10 mEq in 100 mL IVPB (0 mEq Intravenous Stopped 02/29/24 2046)  calcium  gluconate 1 g/ 50 mL sodium chloride  IVPB (0 mg Intravenous Stopped 02/29/24 1942)    Clinical Course as of 02/29/24 2046  Sun Feb 29, 2024  1811 Troponin I (High Sensitivity): 3 [JS]  2032 Potassium(!): 2.8 IV replacement.  [JS]  2032 Creatinine(!): 0.37 Consistent with prior  [JS]    Clinical Course User Index [JS] Charlea Nardo, PA-C                                 Medical Decision Making Amount and/or Complexity of Data Reviewed Labs: ordered. Decision-making details documented in ED Course. Radiology: ordered.  Risk Prescription drug management.   This patient presents to the ED for concern of chest pain, this involves a number  of treatment options, and is a complaint that carries with it a high risk of complications and morbidity.  The differential diagnosis includes ACS, PE versus MSK.    Co morbidities: Discussed in HPI   Brief History:  See HPI.   EMR reviewed including pt PMHx, past surgical history and past visits to ER.   See HPI for more details  Lab Tests:  I ordered and independently interpreted labs.  The pertinent results include:    CBC with no leukocytosis, hemoglobin is slightly decreased. BMP with mild hypokalemia given IV replacement while in the ED. Magnesium is within normal limits.  Calcium  also noted to be low will be replaced.  Troponin is within normal limits x 2.   Imaging Studies:  NAD. I personally reviewed all imaging studies and no acute abnormality found. I agree with radiology interpretation.  Cardiac Monitoring:  The patient was maintained on a cardiac monitor.  I personally viewed and interpreted the cardiac monitored which showed an underlying rhythm of: NSR 56 EKG non-ischemic   Medicines ordered:  I ordered medication including morphine , Zofran , calcium  gluconate for electrolyte arrangement versus symptoms. Reevaluation of the patient after these medicines showed that the patient improved I have reviewed the patients home medicines and have made adjustments as needed  Reevaluation:  After the interventions noted above I re-evaluated patient and found that they have :improved  Social Determinants of Health:  The patient's social determinants of health were a factor in the care of this patient  Problem List / ED Course:  Patient presents with substernal chest pain since this morning, began as intermittent and is now constant.  Patient reports a prior history of an NSTEMI however she did have a normal stress test earlier in the year.  She reports no improvement despite 2 rounds of nitroglycerin.  She did arrive to the ED pretty hypertensive, does have an  underlying history of high blood pressure reports that she has been under a lot of stress.  Blood work here is unremarkable, CBC no leukocytosis, hemoglobin is slightly decreased in her baseline denies any ongoing bleeding at this time.  Magnesium  is within normal limits.  BMP with mild hypokalemia along with some hypocalcemia, both were IV replaced while in the emergency department. X-ray without any signs of pneumonia, no pleural effusion noted. No tachycardia, no hypoxia, no prior history of pulmonary embolisms her shortness of breath has resolved.  Low suspicion for PE. EKG normal sinus rhythm without any changes from prior for comparison.  Troponins x 2 have remained flat.  I have a low suspicion for ACS with a reassuring workup thus far.  Her heart score is 3 and with a reassuring stress test earlier this year I do feel this is likely not ACS at this time.  Patient did receive morphine to help with pain control which did resolve her symptoms.  Upon reevaluation she tells me that she feels much better her blood pressure has improved she is now has blood pressure 149/96, no signs of endorgan damage to suggest any hypertensive urgency versus emergency on arrival. Discussed close follow-up with cardiology as needed.  She is agreeable to plan treatment, hemodynamic stable for discharge.  Dispostion:  After consideration of the diagnostic results and the patients response to treatment, I feel that the patent would benefit from close follow-up with cardiology, return for worsening symptoms.   Portions of this note were generated with Scientist, clinical (histocompatibility and immunogenetics). Dictation errors may occur despite best attempts at proofreading.   Final diagnoses:  Chest pain, unspecified type    ED Discharge Orders     None          Maureen Broad, PA-C 02/29/24 2046    Cottie Donnice PARAS, MD 02/29/24 2116

## 2024-02-29 NOTE — ED Triage Notes (Signed)
 Pt coming in reporting cvhest pain starting when she woke up this morning. Pt reports a recent history of a blockage that was fond by cardiology but it was too small for a stent. Sinus pressure congestion cough hurts to cough. Pt took 324 asprin and 2 nitrotab pain came down to a 6  given 3rd nitrotab by ems. Pt reports pain came down from a 10/10 to a 6/10. Pt reports that her pain is increasing now.   Bp 124/70 Hr 70 98% ra Cbg 123  20 l hand.

## 2024-02-29 NOTE — Discharge Instructions (Addendum)
 Your laboratory results were within normal limits today.  Please follow up with your cardiologist as scheduled.
